# Patient Record
Sex: Female | Born: 1935
Health system: Southern US, Community
[De-identification: ages and names within clinical notes are randomized; demographics above are authoritative.]

## PROBLEM LIST (undated history)

## (undated) DIAGNOSIS — Z0000 Encounter for general adult medical examination without abnormal findings: Secondary | ICD-10-CM

## (undated) DIAGNOSIS — D72819 Decreased white blood cell count, unspecified: Secondary | ICD-10-CM

## (undated) DIAGNOSIS — E785 Hyperlipidemia, unspecified: Secondary | ICD-10-CM

## (undated) DIAGNOSIS — Z8619 Personal history of other infectious and parasitic diseases: Secondary | ICD-10-CM

## (undated) DIAGNOSIS — I480 Paroxysmal atrial fibrillation: Secondary | ICD-10-CM

## (undated) DIAGNOSIS — I10 Essential (primary) hypertension: Secondary | ICD-10-CM

## (undated) DIAGNOSIS — F419 Anxiety disorder, unspecified: Secondary | ICD-10-CM

## (undated) HISTORY — PX: ABDOMINAL HYSTERECTOMY: SHX81

## (undated) HISTORY — DX: Hyperlipidemia, unspecified: E78.5

## (undated) HISTORY — DX: Personal history of other infectious and parasitic diseases: Z86.19

## (undated) HISTORY — DX: Paroxysmal atrial fibrillation: I48.0

## (undated) HISTORY — DX: Essential (primary) hypertension: I10

## (undated) HISTORY — DX: Encounter for general adult medical examination without abnormal findings: Z00.00

## (undated) HISTORY — DX: Decreased white blood cell count, unspecified: D72.819

## (undated) HISTORY — DX: Anxiety disorder, unspecified: F41.9

---

## 1999-05-14 ENCOUNTER — Encounter: Admission: RE | Admit: 1999-05-14 | Discharge: 1999-05-14 | Payer: Self-pay | Admitting: Internal Medicine

## 1999-05-14 ENCOUNTER — Encounter: Payer: Self-pay | Admitting: Internal Medicine

## 1999-06-17 ENCOUNTER — Encounter: Admission: RE | Admit: 1999-06-17 | Discharge: 1999-06-17 | Payer: Self-pay | Admitting: Internal Medicine

## 1999-06-17 ENCOUNTER — Encounter: Payer: Self-pay | Admitting: Internal Medicine

## 2000-05-19 ENCOUNTER — Encounter: Admission: RE | Admit: 2000-05-19 | Discharge: 2000-05-19 | Payer: Self-pay | Admitting: Internal Medicine

## 2000-05-19 ENCOUNTER — Encounter: Payer: Self-pay | Admitting: Internal Medicine

## 2001-04-23 ENCOUNTER — Encounter: Payer: Self-pay | Admitting: Internal Medicine

## 2001-04-23 ENCOUNTER — Encounter: Admission: RE | Admit: 2001-04-23 | Discharge: 2001-04-23 | Payer: Self-pay | Admitting: Internal Medicine

## 2001-06-16 ENCOUNTER — Encounter: Payer: Self-pay | Admitting: Internal Medicine

## 2001-06-16 ENCOUNTER — Encounter: Admission: RE | Admit: 2001-06-16 | Discharge: 2001-06-16 | Payer: Self-pay | Admitting: Internal Medicine

## 2001-06-21 ENCOUNTER — Encounter: Admission: RE | Admit: 2001-06-21 | Discharge: 2001-06-21 | Payer: Self-pay | Admitting: Internal Medicine

## 2001-06-21 ENCOUNTER — Encounter: Payer: Self-pay | Admitting: Internal Medicine

## 2001-07-30 ENCOUNTER — Other Ambulatory Visit: Admission: RE | Admit: 2001-07-30 | Discharge: 2001-07-30 | Payer: Self-pay

## 2001-08-11 ENCOUNTER — Ambulatory Visit: Admission: RE | Admit: 2001-08-11 | Discharge: 2001-08-11 | Payer: Self-pay | Admitting: Gynecology

## 2001-09-01 ENCOUNTER — Encounter (INDEPENDENT_AMBULATORY_CARE_PROVIDER_SITE_OTHER): Payer: Self-pay | Admitting: Specialist

## 2001-09-01 ENCOUNTER — Inpatient Hospital Stay (HOSPITAL_COMMUNITY): Admission: RE | Admit: 2001-09-01 | Discharge: 2001-09-03 | Payer: Self-pay

## 2001-09-21 ENCOUNTER — Encounter: Admission: RE | Admit: 2001-09-21 | Discharge: 2001-09-21 | Payer: Self-pay

## 2002-05-10 ENCOUNTER — Encounter: Admission: RE | Admit: 2002-05-10 | Discharge: 2002-05-10 | Payer: Self-pay

## 2003-05-03 ENCOUNTER — Encounter: Admission: RE | Admit: 2003-05-03 | Discharge: 2003-05-03 | Payer: Self-pay | Admitting: Internal Medicine

## 2004-05-24 ENCOUNTER — Encounter: Admission: RE | Admit: 2004-05-24 | Discharge: 2004-05-24 | Payer: Self-pay | Admitting: Internal Medicine

## 2005-07-07 ENCOUNTER — Encounter: Admission: RE | Admit: 2005-07-07 | Discharge: 2005-07-07 | Payer: Self-pay | Admitting: Internal Medicine

## 2006-07-08 ENCOUNTER — Encounter: Admission: RE | Admit: 2006-07-08 | Discharge: 2006-07-08 | Payer: Self-pay | Admitting: Internal Medicine

## 2007-07-13 ENCOUNTER — Encounter: Admission: RE | Admit: 2007-07-13 | Discharge: 2007-07-13 | Payer: Self-pay | Admitting: Internal Medicine

## 2008-05-09 ENCOUNTER — Emergency Department (HOSPITAL_COMMUNITY): Admission: EM | Admit: 2008-05-09 | Discharge: 2008-05-09 | Payer: Self-pay | Admitting: Emergency Medicine

## 2008-05-15 ENCOUNTER — Encounter: Admission: RE | Admit: 2008-05-15 | Discharge: 2008-05-15 | Payer: Self-pay | Admitting: Internal Medicine

## 2008-08-21 ENCOUNTER — Encounter: Admission: RE | Admit: 2008-08-21 | Discharge: 2008-08-21 | Payer: Self-pay | Admitting: Internal Medicine

## 2009-01-19 IMAGING — US US ABDOMEN COMPLETE
1 series · 14 of 25 positions shown · non-contrast
Comparison: CT 05/09/2008

CLINICAL DATA: Right upper quadrant pain.  Abnormal CT scan.  Rule
out gallstones.

ABDOMEN ULTRASOUND
TECHNIQUE: Complete abdominal ultrasound examination was performed
including evaluation of the liver, gallbladder, bile ducts,
pancreas, kidneys, spleen, IVC, and abdominal aorta.

[Series 1: us abdomen complete · 0.18mm/px · 14 of 90 slices shown]
[im 1/90]
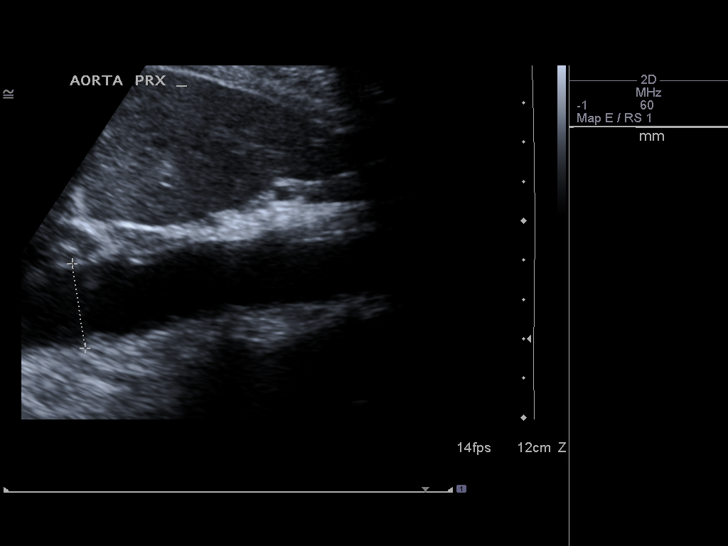
[im 8/90]
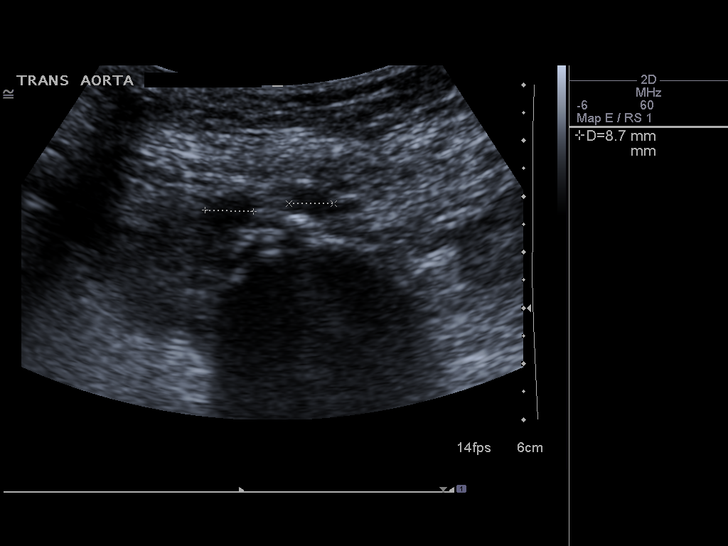
[im 15/90]
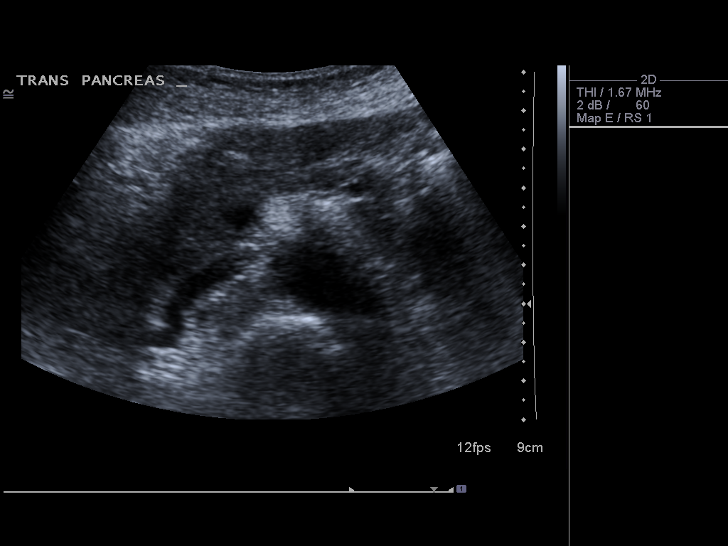
[im 23/90]
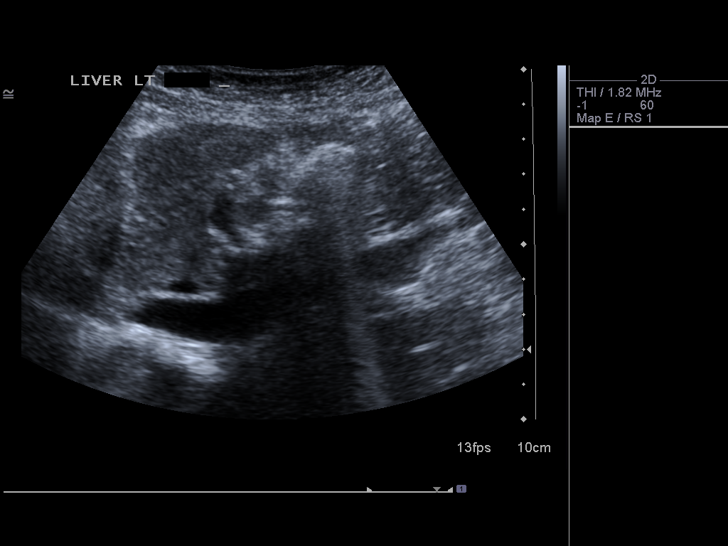
[im 30/90]
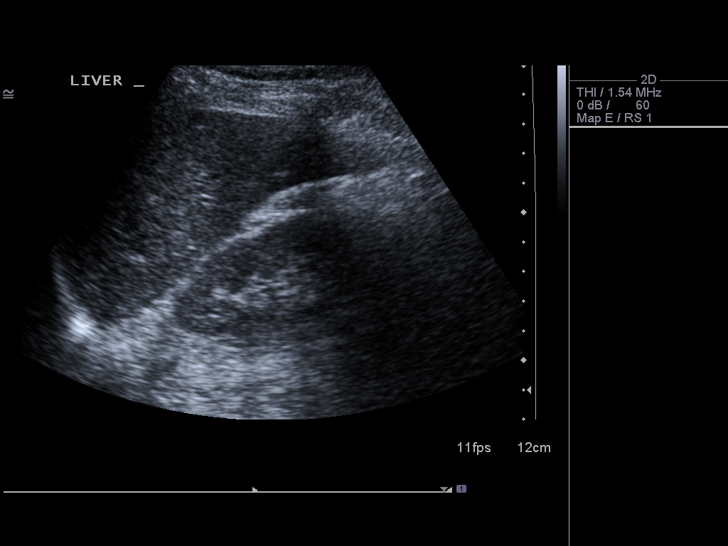
[im 34/90]
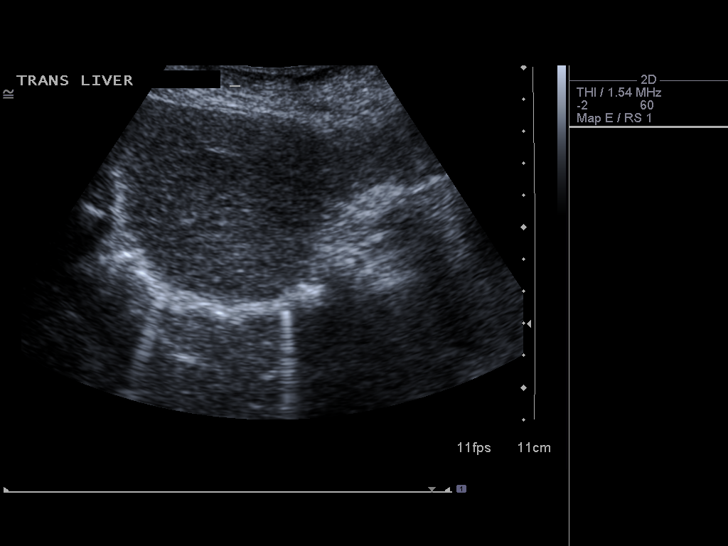
[im 41/90]
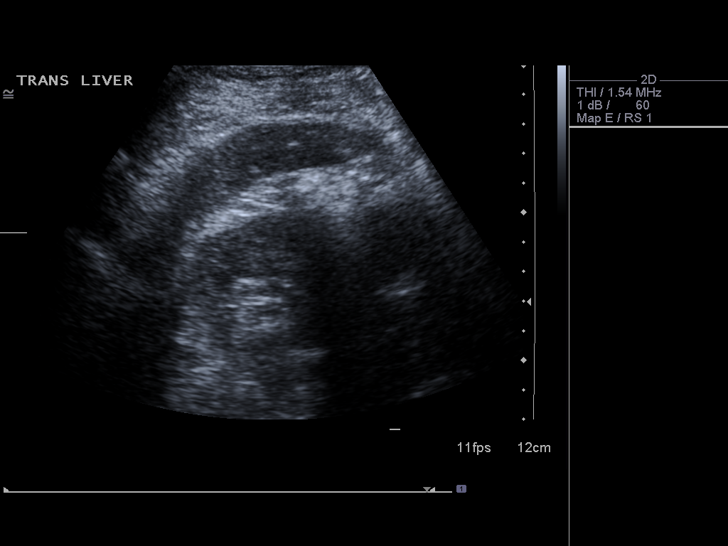
[im 49/90]
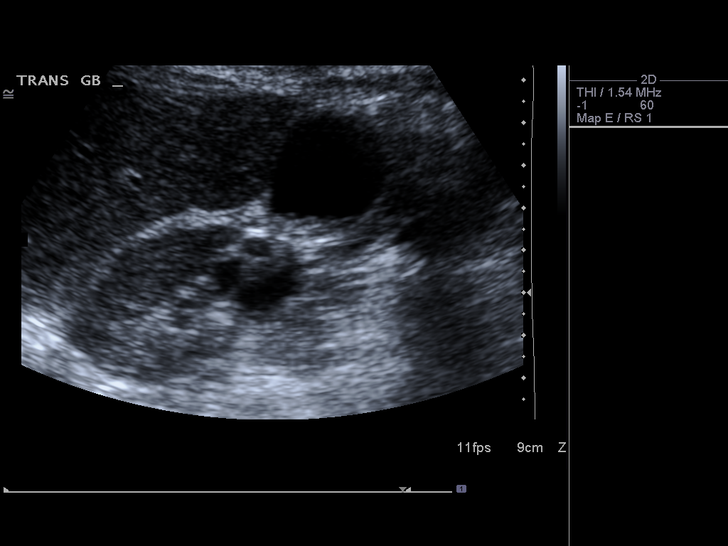
[im 56/90]
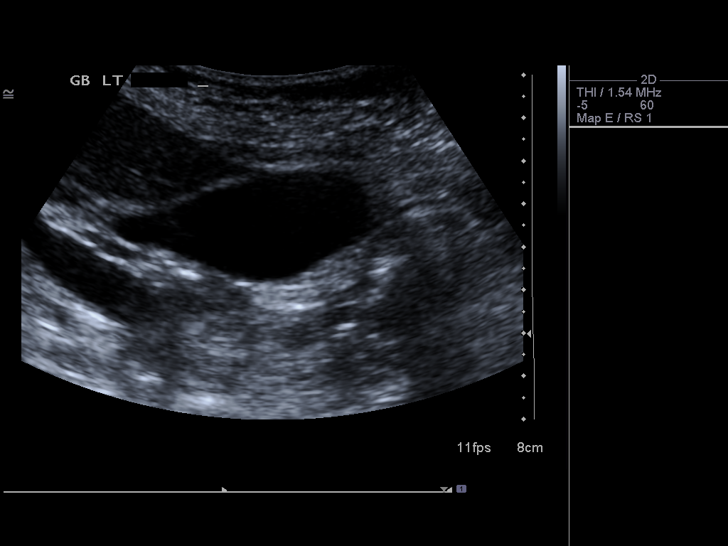
[im 60/90]
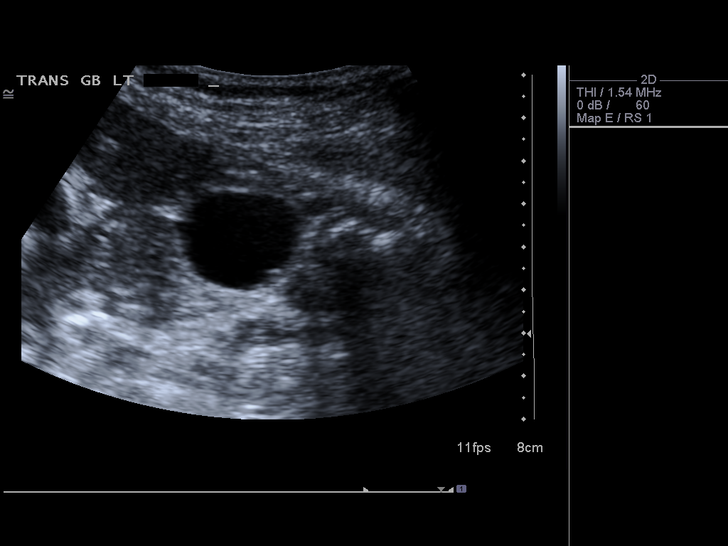
[im 67/90]
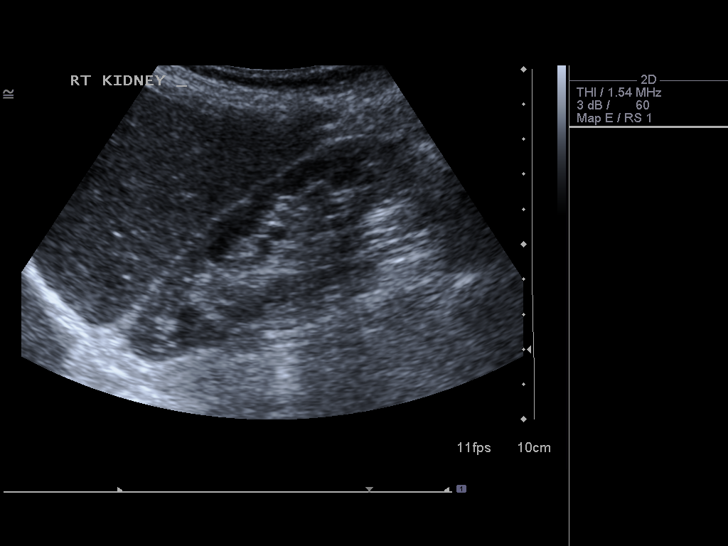
[im 75/90]
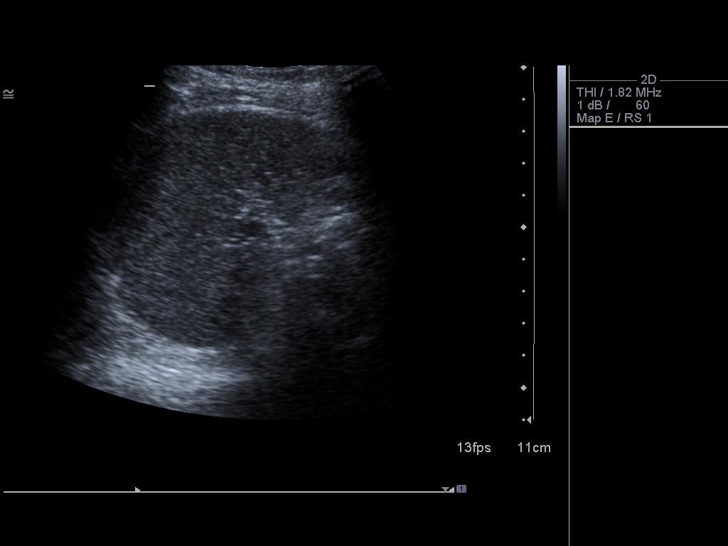
[im 82/90]
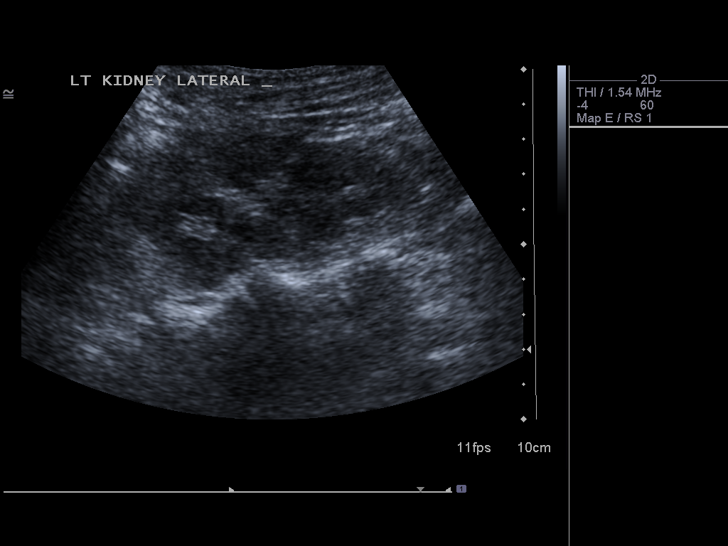
[im 90/90]
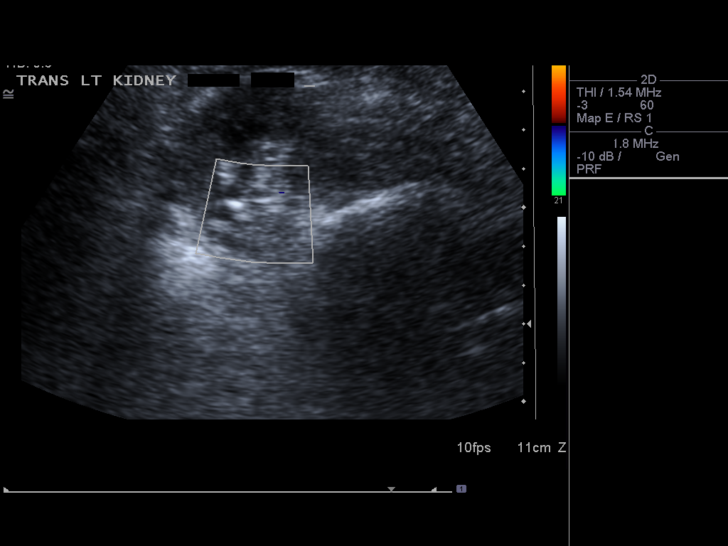

[14 of 25 positions shown; findings below may reference images not displayed]

FINDINGS: There is echogenic material layering dependently in the
gallbladder.  This does  not shadow and  may represent small stones
or some sludge in the gallbladder.  The gallbladder wall is not
thickened.  The common bile duct is 3.8 mm.  The liver, IVC,
pancreas, spleen are normal.  The kidneys are normal in size and
show no obstruction or mass.  There is a small calculus in the left
lower pole.  The abdominal aorta contains atherosclerotic
irregularity but no aneurysm.
IMPRESSION: Echogenic material is present in the gallbladder which does not
shadow and  may represent   sludge or small gallstones.  There is
no biliary obstruction.

Nonobstructing stone in the left lower pole.

## 2010-11-15 NOTE — Discharge Summary (Signed)
[facility]  Patient:    Peggy Peterson, Peggy Peterson Visit Number: [id number] MRN: [id number]          Service Type: SUR Location: [id number] Attending Physician:  Barbaraann Cao Dictated by:   Ronda Fairly. Galen Daft, M.D. Admit Date:  09/01/2001 Discharge Date: 09/03/2001   CC:         Theressa Millard, M.D.   Discharge Summary  ADMISSION DIAGNOSIS:  Adnexal mass, ovarian mass, uncertain etiology.  PRINCIPAL DIAGNOSIS:  Serous cystadenofibroma of the left ovary.  SECONDARY DIAGNOSIS:  Abdominal adhesions and appendix adhesions and ovarian adhesions.  PRINCIPAL PROCEDURE:  Bilateral salpingo-oophorectomy.  SECONDARY PROCEDURE: 1. Lysis of adhesions. 2. Diagnostic laparoscopy.  COMPLICATIONS OF HOSPITALIZATION:  None.  CONDITION AT DISCHARGE:  Improved.  HOSPITAL COURSE:  The patient was admitted on September 01, 2001 with the intention of a diagnostic laparoscopy, possible operative laparoscopy versus laparotomy. The patient was consented for bilateral salpingo-oophorectomy. The procedure was carried out without difficulty. Upon diagnostic laparoscopy, it was noted that there were marked adhesions of the peritoneum surface to the omental surfaces throughout the abdomen. The trocar site was clear of these adhesions. The ovary was somewhat adherent as well, both posteriorly and anteriorly and laterally. This was the ovarian mass. The left ovary could not be identified laparoscopically. Because of the marked adhesions present, the decision was made to switch from laparoscopic approach to open laparotomy approach. Using a Pfannenstiel incision, the laparotomy was carried out, bilateral salpingo-oophorectomy was carried out. The ovary had to be freed from its adhesive properties to the appendix where it was densely adherent and the appendix was left uninjured. The rectum also was adherent and this was dissected in the space which would typically be the rectovaginal space  but in this case was the recto-ovarian plane that had to be developed. The cyst remained intact and was sent off to pathology as an adnexal mass/ovary and tube intact. The left ovary was adherent in the posterior peritoneum. It was retroperitoneal and it required careful dissection of the ureter away from the field. After it was done, the left ovary and tube were removed. The patient had no active bleeding and the adhesions had been freed. Care was taken to inspect to make sure there was no evidence of any internal hernias. The abdomen was closed and the patient was discharged from the operating room.  In the postoperative course, postoperative day #1, she was doing quite well. I maintained her on patient-controlled analgesia and IV fluids for that day. On the second postoperative day, she was improving, tolerating liquids well, had urinated spontaneously without difficulty, and was showing signs of return of bowel function. The abdomen was soft, nondistended. The incision was clean, dry and intact and it was my desire to continue her on clear liquids because of the adhesions for a couple of more days prior to advancing her diet. She was therefore to be discharged home on clear liquids and followed in the office in approximately two weeks. The pathology came back as a benign serous cystadenofibroma and the left ovary was a benign ovary and tube. There was no evidence of any other signs of malignancy or premalignant condition.  DISPOSITION:   The patient was given discharge instructions, Percocet for analgesia, and full instructions regarding wound care, restrictions of movement and followup instructions and diet. She will begin regular diet as early as Monday, March 10 if she was doing well. The patient was discharged with no complications. Dictated by:  Ronda Fairly. Galen Daft, M.D. Attending Physician:  Barbaraann Cao DD:  09/03/01 TD:  09/06/01 Job: [id number]

## 2010-11-15 NOTE — Consult Note (Signed)
[facility]  Patient:    Peggy Peterson, Peggy Peterson Visit Number: [id number] MRN: [id number]          Service Type: Attending:  Rande Brunt. Clarke-Pearson, M.D. Dictated by:   Rande Brunt. Clarke-Pearson, M.D. Proc. Date: 08/11/01   CC:         Ronda Fairly. Galen Daft, M.D.  Theressa Millard, M.D.  Telford Nab, R.N.   Consultation Report  A 77 year old white female seen in consultation at the request of Dr. Gwendalyn Ege for evaluation of a complex mass.  The patient while undergoing work-up for upper abdominal discomfort which was thought to be possibly cholecystitis had a CT scan of the abdomen and pelvis which discovered an asymptomatic mass in the pelvis measuring 8.2 x 9.3 cm.  This is a complex cystic mass.  It apparently arises from the right ovary because the left ovary was visualized and was normal.  There was no free fluid in the pelvis and no evidence of tumor implants in the peritoneal cavity or omentum.  CA-125 value is 10. Ultrasound has also been performed which confirms CT findings.  There is no solid component or nodularities in the cyst.  PAST GYNECOLOGIC HISTORY:  Abdominal hysterectomy for abnormal bleeding two decades ago.  PAST MEDICAL HISTORY:  Diverticulitis occasionally treated with antibiotics when she develops left-sided pain.  CURRENT MEDICATIONS: 1. Xanax 0.25 mg q.12h. 2. Estrace 1.5 mg daily. 3. Aspirin.  PAST SURGICAL HISTORY:  TAH, tonsils and adenoidectomy, breast cyst aspiration.  ALLERGIES:  None.  PAST OBSTETRICAL HISTORY:  Gravida 3.  SOCIAL HISTORY:  The patient does not smoke.  She works as a Scientist, physiological in a Psychiatric nurse business.  REVIEW OF SYSTEMS:  Negative except as noted above.  PHYSICAL EXAMINATION  VITAL SIGNS:  Height 4 feet 11 inches, weight 122 pounds, blood pressure 126/82, pulse 70, respiratory rate 18.  GENERAL:  The patient is a healthy white female in no acute distress.  HEENT:  Negative.  NECK:   Supple without thyromegaly.  NODES:  There is no supraclavicular or inguinal adenopathy.  ABDOMEN:  Soft, nontender.  No masses, organomegaly, ascites, or hernias are noted.  She has a well healed Pfannenstiel incision.  PELVIC:  EGBUS:  Normal.  Vagina is clean, well supported.  Bimanual and rectovaginal examinations reveal a mass at the apex of the vagina measuring approximately 8 cm in diameter.  This is not tender and mobile.  EXTREMITIES:  Lower extremities without edema or varicosities.  IMPRESSION:  Complex pelvic mass without any solid components or nodularity and a normal CA-125.  I believe this is most likely benign ovarian neoplasm.  I have discussed the findings and my thoughts with the patient.  Given that I believe this is a benign neoplasm, would suggest Dr. Galen Daft proceed with surgery at his convenience.  On the very small chance that this is an ovarian cancer, it would be wise to schedule the surgery on a day when I am in [place] and have me posted as a standby consult. Dictated by:   Rande Brunt. Clarke-Pearson, M.D. Attending:  Rande Brunt. Clarke-Pearson, M.D. DD:  08/11/01 TD:  08/11/01 Job: [id number]

## 2010-11-15 NOTE — Op Note (Signed)
[facility]  Patient:    Peggy, Peterson Visit Number: [id number] MRN: [id number]          Service Type: MED Location: [phone] Attending Physician:  Barbaraann Cao Dictated by:   Ronda Fairly. Galen Daft, M.D. Proc. Date: 09/01/01 Admit Date:  09/01/2001   CC:         Fayrene Fearing C. Earl Gala, M.D.  Trellis Moment, M.D.   Operative Report  PREOPERATIVE DIAGNOSIS:  Pelvic mass.  POSTOPERATIVE DIAGNOSES: 1. Pelvic mass. 2. Severe pelvic intestinal adhesions. 4. Right serous cystadenoma. 5. Normal left ovary retroperitoneal.  PROCEDURE: 1. Diagnostic laparoscopy. 2. Exploratory laparotomy with lysis of adhesions. 3. Right salpingo-oophorectomy followed by left salpingo-oophorectomy.  SURGEON:  Ronda Fairly. Galen Daft, M.D.  ASSISTANT:  Trellis Moment, M.D.  COMPLICATIONS:  None.  ESTIMATED BLOOD LOSS:  200 cc.  ANESTHESIA:  General.  SPECIMEN:  Right tube and ovary and left tube and ovary.  OPERATIVE NOTE:  The patient was identified as Peggy Peterson.  We obtained informed consent prior to the procedure.  She wished to have bilateral salpingo-oophorectomy, and the discussion of laparoscopy approach versus laparotomy approach had been reviewed with the patient preoperatively.  After induction of anesthesia, the patient had a Betadine prep and Foley indwelling catheter.  The initial incision was an umbilical incision which allowed for the Veress needle insertion.  Its insertion was deemed to be correct by placement and by pressures.  The abdomen was inflated with carbon dioxide gas. Next, the trocar was placed in the umbilical site.  This revealed the evidence of severe adhesions of the omentum.  A secondary site was created to reevaluate the adhesions to see if these were laparoscopically resectable. These were felt to be not laparoscopically resectable based on their density from the secondary port as well, and the second port was placed under  direct visualization in a clear zone.  The ovary itself also was somewhat adherent to these adhesions and large and cystic.  The side it was arising from, from laparoscopy was not readily identified secondary to adhesions.  Therefore, it was felt prudent to convert to laparotomy and patient had provided informed consent prior to the procedure as well.  Laparotomy was carried out through a Pfannenstiel incision.  The fascia was transected transversely and the abdomen entered at a point clear of the adhesions.  The omental adhesions were dense, requiring several Kelly clamps and ligatures of the upper abdomen.  These omental adhesions were freed with some time, and the other adhesions that were present was the appendix which was densely adherent to the ovarian mass.  This was gracefully lysed with Metzenbaum scissor dissection away from their, and it was hemostatic.  The appendix was inspected at this point in the case and intact, and it was inspected also at the end of the case and found to be intact.  The rectum also similarly on the posterior end was intimately involved with the posterior wall of this mass.  Therefore, the plane had to be developed using Metzenbaum scissor dissection along this region as well. Prior to resection of the mass, the washings were carried out in case the mass was malignant by frozen section.  The infundibulopelvic ligament was identified, and it was separate and distinct from the ureter which also was identified.  The ureter itself required ureterolysis in an effort to free it from the mass area.  The ureter was separate and distinct at the time of surgical excision of the mass.  After clamping of the infundibulopelvic pelvic ligament, it was divided and ligated with 0 Vicryl tie followed by 0 Vicryl suture.  The remaining adhesions were taken off the adnexal mass, and it was freed.  It was sent to pathology.  Diagnosis was benign cyst and serous cystadenoma.   The ovary and its cystic components were sent off intact without rupture.  The left ovary was not visible because it was retroperitoneal.  It required dissection into the retroperitoneal space as was the tube somewhat buried as well retroperitoneally and, also, identification of the ureter was carried out carefully to be separate and distinct from the vascular supply for this ovary, the left ovary being identified and tube separate and distinct from other structures, infundibulopelvic ligament separate and distinct from other structures.  This was clamped, ligated, and divided.  The ligature was 0 Vicryl followed by 0 Vicryl tie.  The left ovary and tube were removed, and hemostasis was noted.  The ureters on both sides were noted to have free peristalsis and no dilatation.  The tissues in the lower abdomen were hemostatic, but there was raw surface at the level of the lysis of adhesions. This was prophylatically treated with Gelfoam in this area as well as the raw surface overlying the rectum.  The rectum was intact and hemostatic.  The areas were inspected on the other areas, and they were all hemostatic as well. The instrument, sponge, and needle counts were correct after removal of the retaining retractor and sponges.  The tissues all underneath the fascia were hemostatic and fascia was closed with #1 Vicryl from either angle.  All subcutaneous tissues were hemostatic.  Prior to closure of the fascia, the 10 mm trocar port area entry into the fascia was closed with a single interrupted figure-of-eight suture.  The subcutaneous tissues were reapproximated using interrupted Vicryl, and the skin was closed with 4-0 Monocryl in the longitudinal incision running subcuticular followed by two interrupted sutures in both the umbilical and secondary trocar site areas. All instrument, sponge, and needle counts were correct.  The patient left the operating room in stable condition without any  complications.  Dictated by:   Ronda Fairly. Galen Daft, M.D. Attending Physician:  Barbaraann Cao DD:  09/01/01 TD:  09/02/01 Job: [id number]

## 2011-04-01 LAB — DIFFERENTIAL
Basophils Absolute: 0
Basophils Relative: 0
Lymphs Abs: 1
Monocytes Absolute: 0.3
Neutro Abs: 2.7
Neutrophils Relative %: 67

## 2011-04-01 LAB — URINALYSIS, ROUTINE W REFLEX MICROSCOPIC
Bilirubin Urine: NEGATIVE
Hgb urine dipstick: NEGATIVE

## 2011-04-01 LAB — COMPREHENSIVE METABOLIC PANEL
AST: 20
Alkaline Phosphatase: 63
Calcium: 9.6
Chloride: 107
GFR calc non Af Amer: >60
Glucose, Bld: 110 — ABNORMAL HIGH
Potassium: 4.1

## 2011-04-01 LAB — CBC
Hemoglobin: 13.8
MCV: 89.5
Platelets: 149 — ABNORMAL LOW
RBC: 4.58
RDW: 13.6

## 2011-07-15 DIAGNOSIS — H6120 Impacted cerumen, unspecified ear: Secondary | ICD-10-CM | POA: Diagnosis not present

## 2011-10-08 DIAGNOSIS — Z1331 Encounter for screening for depression: Secondary | ICD-10-CM | POA: Diagnosis not present

## 2011-10-08 DIAGNOSIS — C4491 Basal cell carcinoma of skin, unspecified: Secondary | ICD-10-CM | POA: Diagnosis not present

## 2011-10-08 DIAGNOSIS — I4891 Unspecified atrial fibrillation: Secondary | ICD-10-CM | POA: Diagnosis not present

## 2011-10-08 DIAGNOSIS — I10 Essential (primary) hypertension: Secondary | ICD-10-CM | POA: Diagnosis not present

## 2011-10-22 DIAGNOSIS — K469 Unspecified abdominal hernia without obstruction or gangrene: Secondary | ICD-10-CM | POA: Diagnosis not present

## 2011-11-04 DIAGNOSIS — C44611 Basal cell carcinoma of skin of unspecified upper limb, including shoulder: Secondary | ICD-10-CM | POA: Diagnosis not present

## 2011-11-04 DIAGNOSIS — L821 Other seborrheic keratosis: Secondary | ICD-10-CM | POA: Diagnosis not present

## 2011-11-04 DIAGNOSIS — Z85828 Personal history of other malignant neoplasm of skin: Secondary | ICD-10-CM | POA: Diagnosis not present

## 2011-12-01 DIAGNOSIS — H259 Unspecified age-related cataract: Secondary | ICD-10-CM | POA: Diagnosis not present

## 2011-12-01 DIAGNOSIS — H524 Presbyopia: Secondary | ICD-10-CM | POA: Diagnosis not present

## 2011-12-01 DIAGNOSIS — H52229 Regular astigmatism, unspecified eye: Secondary | ICD-10-CM | POA: Diagnosis not present

## 2012-02-16 DIAGNOSIS — R636 Underweight: Secondary | ICD-10-CM | POA: Diagnosis not present

## 2012-02-16 DIAGNOSIS — I10 Essential (primary) hypertension: Secondary | ICD-10-CM | POA: Diagnosis not present

## 2012-04-22 DIAGNOSIS — T148XXA Other injury of unspecified body region, initial encounter: Secondary | ICD-10-CM | POA: Diagnosis not present

## 2012-05-14 DIAGNOSIS — H6120 Impacted cerumen, unspecified ear: Secondary | ICD-10-CM | POA: Diagnosis not present

## 2012-06-07 DIAGNOSIS — I10 Essential (primary) hypertension: Secondary | ICD-10-CM | POA: Diagnosis not present

## 2012-06-25 DIAGNOSIS — M545 Low back pain, unspecified: Secondary | ICD-10-CM | POA: Diagnosis not present

## 2012-06-29 DIAGNOSIS — M999 Biomechanical lesion, unspecified: Secondary | ICD-10-CM | POA: Diagnosis not present

## 2012-06-29 DIAGNOSIS — G544 Lumbosacral root disorders, not elsewhere classified: Secondary | ICD-10-CM | POA: Diagnosis not present

## 2012-07-02 DIAGNOSIS — G544 Lumbosacral root disorders, not elsewhere classified: Secondary | ICD-10-CM | POA: Diagnosis not present

## 2012-07-02 DIAGNOSIS — M999 Biomechanical lesion, unspecified: Secondary | ICD-10-CM | POA: Diagnosis not present

## 2012-07-05 DIAGNOSIS — M999 Biomechanical lesion, unspecified: Secondary | ICD-10-CM | POA: Diagnosis not present

## 2012-07-05 DIAGNOSIS — G544 Lumbosacral root disorders, not elsewhere classified: Secondary | ICD-10-CM | POA: Diagnosis not present

## 2012-07-07 DIAGNOSIS — G544 Lumbosacral root disorders, not elsewhere classified: Secondary | ICD-10-CM | POA: Diagnosis not present

## 2012-07-07 DIAGNOSIS — M999 Biomechanical lesion, unspecified: Secondary | ICD-10-CM | POA: Diagnosis not present

## 2012-07-08 DIAGNOSIS — G544 Lumbosacral root disorders, not elsewhere classified: Secondary | ICD-10-CM | POA: Diagnosis not present

## 2012-07-08 DIAGNOSIS — M999 Biomechanical lesion, unspecified: Secondary | ICD-10-CM | POA: Diagnosis not present

## 2012-07-12 DIAGNOSIS — G544 Lumbosacral root disorders, not elsewhere classified: Secondary | ICD-10-CM | POA: Diagnosis not present

## 2012-07-12 DIAGNOSIS — M999 Biomechanical lesion, unspecified: Secondary | ICD-10-CM | POA: Diagnosis not present

## 2012-07-13 DIAGNOSIS — M999 Biomechanical lesion, unspecified: Secondary | ICD-10-CM | POA: Diagnosis not present

## 2012-07-13 DIAGNOSIS — G544 Lumbosacral root disorders, not elsewhere classified: Secondary | ICD-10-CM | POA: Diagnosis not present

## 2012-07-15 DIAGNOSIS — M999 Biomechanical lesion, unspecified: Secondary | ICD-10-CM | POA: Diagnosis not present

## 2012-07-15 DIAGNOSIS — G544 Lumbosacral root disorders, not elsewhere classified: Secondary | ICD-10-CM | POA: Diagnosis not present

## 2012-07-20 DIAGNOSIS — M999 Biomechanical lesion, unspecified: Secondary | ICD-10-CM | POA: Diagnosis not present

## 2012-07-20 DIAGNOSIS — G544 Lumbosacral root disorders, not elsewhere classified: Secondary | ICD-10-CM | POA: Diagnosis not present

## 2012-07-21 DIAGNOSIS — M545 Low back pain, unspecified: Secondary | ICD-10-CM | POA: Diagnosis not present

## 2012-07-22 DIAGNOSIS — M999 Biomechanical lesion, unspecified: Secondary | ICD-10-CM | POA: Diagnosis not present

## 2012-07-22 DIAGNOSIS — G544 Lumbosacral root disorders, not elsewhere classified: Secondary | ICD-10-CM | POA: Diagnosis not present

## 2012-07-23 DIAGNOSIS — M999 Biomechanical lesion, unspecified: Secondary | ICD-10-CM | POA: Diagnosis not present

## 2012-07-23 DIAGNOSIS — G544 Lumbosacral root disorders, not elsewhere classified: Secondary | ICD-10-CM | POA: Diagnosis not present

## 2012-07-26 DIAGNOSIS — G544 Lumbosacral root disorders, not elsewhere classified: Secondary | ICD-10-CM | POA: Diagnosis not present

## 2012-07-26 DIAGNOSIS — M999 Biomechanical lesion, unspecified: Secondary | ICD-10-CM | POA: Diagnosis not present

## 2012-08-02 DIAGNOSIS — M999 Biomechanical lesion, unspecified: Secondary | ICD-10-CM | POA: Diagnosis not present

## 2012-08-02 DIAGNOSIS — G544 Lumbosacral root disorders, not elsewhere classified: Secondary | ICD-10-CM | POA: Diagnosis not present

## 2012-08-03 DIAGNOSIS — G544 Lumbosacral root disorders, not elsewhere classified: Secondary | ICD-10-CM | POA: Diagnosis not present

## 2012-08-03 DIAGNOSIS — M999 Biomechanical lesion, unspecified: Secondary | ICD-10-CM | POA: Diagnosis not present

## 2012-08-09 DIAGNOSIS — G544 Lumbosacral root disorders, not elsewhere classified: Secondary | ICD-10-CM | POA: Diagnosis not present

## 2012-08-09 DIAGNOSIS — M999 Biomechanical lesion, unspecified: Secondary | ICD-10-CM | POA: Diagnosis not present

## 2012-08-10 DIAGNOSIS — M999 Biomechanical lesion, unspecified: Secondary | ICD-10-CM | POA: Diagnosis not present

## 2012-08-10 DIAGNOSIS — G544 Lumbosacral root disorders, not elsewhere classified: Secondary | ICD-10-CM | POA: Diagnosis not present

## 2012-08-17 DIAGNOSIS — G544 Lumbosacral root disorders, not elsewhere classified: Secondary | ICD-10-CM | POA: Diagnosis not present

## 2012-08-17 DIAGNOSIS — M999 Biomechanical lesion, unspecified: Secondary | ICD-10-CM | POA: Diagnosis not present

## 2012-08-18 DIAGNOSIS — M999 Biomechanical lesion, unspecified: Secondary | ICD-10-CM | POA: Diagnosis not present

## 2012-08-18 DIAGNOSIS — G544 Lumbosacral root disorders, not elsewhere classified: Secondary | ICD-10-CM | POA: Diagnosis not present

## 2012-08-23 DIAGNOSIS — M999 Biomechanical lesion, unspecified: Secondary | ICD-10-CM | POA: Diagnosis not present

## 2012-08-23 DIAGNOSIS — G544 Lumbosacral root disorders, not elsewhere classified: Secondary | ICD-10-CM | POA: Diagnosis not present

## 2012-08-24 DIAGNOSIS — G544 Lumbosacral root disorders, not elsewhere classified: Secondary | ICD-10-CM | POA: Diagnosis not present

## 2012-08-24 DIAGNOSIS — M999 Biomechanical lesion, unspecified: Secondary | ICD-10-CM | POA: Diagnosis not present

## 2012-08-30 DIAGNOSIS — G544 Lumbosacral root disorders, not elsewhere classified: Secondary | ICD-10-CM | POA: Diagnosis not present

## 2012-08-30 DIAGNOSIS — M999 Biomechanical lesion, unspecified: Secondary | ICD-10-CM | POA: Diagnosis not present

## 2012-09-01 DIAGNOSIS — M999 Biomechanical lesion, unspecified: Secondary | ICD-10-CM | POA: Diagnosis not present

## 2012-09-01 DIAGNOSIS — G544 Lumbosacral root disorders, not elsewhere classified: Secondary | ICD-10-CM | POA: Diagnosis not present

## 2012-09-10 DIAGNOSIS — H259 Unspecified age-related cataract: Secondary | ICD-10-CM | POA: Diagnosis not present

## 2012-10-06 DIAGNOSIS — I10 Essential (primary) hypertension: Secondary | ICD-10-CM | POA: Diagnosis not present

## 2013-02-14 DIAGNOSIS — I10 Essential (primary) hypertension: Secondary | ICD-10-CM | POA: Diagnosis not present

## 2013-06-28 DIAGNOSIS — I10 Essential (primary) hypertension: Secondary | ICD-10-CM | POA: Diagnosis not present

## 2013-10-26 DIAGNOSIS — I10 Essential (primary) hypertension: Secondary | ICD-10-CM | POA: Diagnosis not present

## 2013-10-26 DIAGNOSIS — F411 Generalized anxiety disorder: Secondary | ICD-10-CM | POA: Diagnosis not present

## 2014-03-15 DIAGNOSIS — F411 Generalized anxiety disorder: Secondary | ICD-10-CM | POA: Diagnosis not present

## 2014-03-15 DIAGNOSIS — I10 Essential (primary) hypertension: Secondary | ICD-10-CM | POA: Diagnosis not present

## 2014-04-10 DIAGNOSIS — E785 Hyperlipidemia, unspecified: Secondary | ICD-10-CM | POA: Diagnosis not present

## 2014-04-10 DIAGNOSIS — I10 Essential (primary) hypertension: Secondary | ICD-10-CM | POA: Diagnosis not present

## 2014-04-10 DIAGNOSIS — R739 Hyperglycemia, unspecified: Secondary | ICD-10-CM | POA: Diagnosis not present

## 2014-04-10 DIAGNOSIS — F419 Anxiety disorder, unspecified: Secondary | ICD-10-CM | POA: Diagnosis not present

## 2014-04-10 DIAGNOSIS — R109 Unspecified abdominal pain: Secondary | ICD-10-CM | POA: Diagnosis not present

## 2014-04-10 DIAGNOSIS — K5900 Constipation, unspecified: Secondary | ICD-10-CM | POA: Diagnosis not present

## 2014-05-09 DIAGNOSIS — R739 Hyperglycemia, unspecified: Secondary | ICD-10-CM | POA: Diagnosis not present

## 2014-05-09 DIAGNOSIS — E785 Hyperlipidemia, unspecified: Secondary | ICD-10-CM | POA: Diagnosis not present

## 2014-05-09 DIAGNOSIS — R109 Unspecified abdominal pain: Secondary | ICD-10-CM | POA: Diagnosis not present

## 2014-05-09 DIAGNOSIS — I10 Essential (primary) hypertension: Secondary | ICD-10-CM | POA: Diagnosis not present

## 2014-05-09 DIAGNOSIS — R7309 Other abnormal glucose: Secondary | ICD-10-CM | POA: Diagnosis not present

## 2014-05-09 DIAGNOSIS — F419 Anxiety disorder, unspecified: Secondary | ICD-10-CM | POA: Diagnosis not present

## 2014-05-09 DIAGNOSIS — K5900 Constipation, unspecified: Secondary | ICD-10-CM | POA: Diagnosis not present

## 2014-06-11 DIAGNOSIS — Z23 Encounter for immunization: Secondary | ICD-10-CM | POA: Diagnosis not present

## 2014-08-21 DIAGNOSIS — E785 Hyperlipidemia, unspecified: Secondary | ICD-10-CM | POA: Diagnosis not present

## 2014-08-21 DIAGNOSIS — F419 Anxiety disorder, unspecified: Secondary | ICD-10-CM | POA: Diagnosis not present

## 2014-08-21 DIAGNOSIS — K5900 Constipation, unspecified: Secondary | ICD-10-CM | POA: Diagnosis not present

## 2014-08-21 DIAGNOSIS — R7309 Other abnormal glucose: Secondary | ICD-10-CM | POA: Diagnosis not present

## 2014-08-21 DIAGNOSIS — I10 Essential (primary) hypertension: Secondary | ICD-10-CM | POA: Diagnosis not present

## 2014-09-15 DIAGNOSIS — K5900 Constipation, unspecified: Secondary | ICD-10-CM | POA: Diagnosis not present

## 2014-09-15 DIAGNOSIS — F419 Anxiety disorder, unspecified: Secondary | ICD-10-CM | POA: Diagnosis not present

## 2014-09-15 DIAGNOSIS — I10 Essential (primary) hypertension: Secondary | ICD-10-CM | POA: Diagnosis not present

## 2014-09-15 DIAGNOSIS — R7309 Other abnormal glucose: Secondary | ICD-10-CM | POA: Diagnosis not present

## 2014-09-15 DIAGNOSIS — E785 Hyperlipidemia, unspecified: Secondary | ICD-10-CM | POA: Diagnosis not present

## 2014-09-15 DIAGNOSIS — N951 Menopausal and female climacteric states: Secondary | ICD-10-CM | POA: Diagnosis not present

## 2014-10-16 DIAGNOSIS — R232 Flushing: Secondary | ICD-10-CM | POA: Diagnosis not present

## 2014-10-16 DIAGNOSIS — R194 Change in bowel habit: Secondary | ICD-10-CM | POA: Diagnosis not present

## 2014-10-17 DIAGNOSIS — I739 Peripheral vascular disease, unspecified: Secondary | ICD-10-CM | POA: Diagnosis not present

## 2014-11-20 DIAGNOSIS — K5900 Constipation, unspecified: Secondary | ICD-10-CM | POA: Diagnosis not present

## 2014-11-20 DIAGNOSIS — E785 Hyperlipidemia, unspecified: Secondary | ICD-10-CM | POA: Diagnosis not present

## 2014-11-20 DIAGNOSIS — R7309 Other abnormal glucose: Secondary | ICD-10-CM | POA: Diagnosis not present

## 2014-11-20 DIAGNOSIS — N951 Menopausal and female climacteric states: Secondary | ICD-10-CM | POA: Diagnosis not present

## 2014-11-20 DIAGNOSIS — F419 Anxiety disorder, unspecified: Secondary | ICD-10-CM | POA: Diagnosis not present

## 2014-11-20 DIAGNOSIS — I10 Essential (primary) hypertension: Secondary | ICD-10-CM | POA: Diagnosis not present

## 2015-02-05 DIAGNOSIS — R7309 Other abnormal glucose: Secondary | ICD-10-CM | POA: Diagnosis not present

## 2015-02-05 DIAGNOSIS — Z0000 Encounter for general adult medical examination without abnormal findings: Secondary | ICD-10-CM | POA: Diagnosis not present

## 2015-02-05 DIAGNOSIS — N951 Menopausal and female climacteric states: Secondary | ICD-10-CM | POA: Diagnosis not present

## 2015-02-05 DIAGNOSIS — Z1389 Encounter for screening for other disorder: Secondary | ICD-10-CM | POA: Diagnosis not present

## 2015-02-05 DIAGNOSIS — Z01118 Encounter for examination of ears and hearing with other abnormal findings: Secondary | ICD-10-CM | POA: Diagnosis not present

## 2015-02-05 DIAGNOSIS — K5900 Constipation, unspecified: Secondary | ICD-10-CM | POA: Diagnosis not present

## 2015-02-05 DIAGNOSIS — E785 Hyperlipidemia, unspecified: Secondary | ICD-10-CM | POA: Diagnosis not present

## 2015-02-05 DIAGNOSIS — F419 Anxiety disorder, unspecified: Secondary | ICD-10-CM | POA: Diagnosis not present

## 2015-02-05 DIAGNOSIS — I10 Essential (primary) hypertension: Secondary | ICD-10-CM | POA: Diagnosis not present

## 2015-03-26 ENCOUNTER — Encounter: Payer: Self-pay | Admitting: *Deleted

## 2015-03-26 ENCOUNTER — Telehealth: Payer: Self-pay | Admitting: *Deleted

## 2015-03-26 NOTE — Telephone Encounter (Signed)
Pre-Visit Call completed with patient and chart updated.   Pre-Visit Info documented in Specialty Comments under [employer].    

## 2015-03-27 ENCOUNTER — Ambulatory Visit: Payer: Self-pay | Admitting: Family Medicine

## 2015-04-04 ENCOUNTER — Telehealth: Payer: Self-pay | Admitting: Behavioral Health

## 2015-04-04 NOTE — Telephone Encounter (Signed)
Pre-Visit Info has already been completed with other RN staff. Appointment  confirmed for tomorrow, 10/6 at 2:00 PM.

## 2015-04-05 ENCOUNTER — Ambulatory Visit: Payer: Self-pay | Admitting: Family Medicine

## 2015-04-05 ENCOUNTER — Encounter: Payer: Self-pay | Admitting: Family Medicine

## 2015-04-05 ENCOUNTER — Ambulatory Visit (INDEPENDENT_AMBULATORY_CARE_PROVIDER_SITE_OTHER): Payer: Medicare Other | Admitting: Family Medicine

## 2015-04-05 VITALS — BP 138/74 | HR 65 | Temp 98.2°F | Ht 59.5 in | Wt 96.2 lb

## 2015-04-05 DIAGNOSIS — I480 Paroxysmal atrial fibrillation: Secondary | ICD-10-CM

## 2015-04-05 DIAGNOSIS — E782 Mixed hyperlipidemia: Secondary | ICD-10-CM

## 2015-04-05 DIAGNOSIS — Z8619 Personal history of other infectious and parasitic diseases: Secondary | ICD-10-CM | POA: Insufficient documentation

## 2015-04-05 DIAGNOSIS — Z23 Encounter for immunization: Secondary | ICD-10-CM

## 2015-04-05 DIAGNOSIS — D72819 Decreased white blood cell count, unspecified: Secondary | ICD-10-CM

## 2015-04-05 DIAGNOSIS — E785 Hyperlipidemia, unspecified: Secondary | ICD-10-CM

## 2015-04-05 DIAGNOSIS — F419 Anxiety disorder, unspecified: Secondary | ICD-10-CM | POA: Diagnosis not present

## 2015-04-05 DIAGNOSIS — I10 Essential (primary) hypertension: Secondary | ICD-10-CM | POA: Diagnosis not present

## 2015-04-05 HISTORY — DX: Paroxysmal atrial fibrillation: I48.0

## 2015-04-05 MED ORDER — ATENOLOL 50 MG PO TABS: 50.0000 mg | ORAL_TABLET | Freq: Every day | ORAL | Status: DC

## 2015-04-05 MED ORDER — SERTRALINE HCL 25 MG PO TABS: 25.0000 mg | ORAL_TABLET | Freq: Every day | ORAL | Status: DC

## 2015-04-05 MED ORDER — LISINOPRIL 10 MG PO TABS: 10.0000 mg | ORAL_TABLET | Freq: Every day | ORAL | Status: DC

## 2015-04-05 NOTE — Progress Notes (Signed)
Pre visit review using our clinic review tool, if applicable. No additional management support is needed unless otherwise documented below in the visit note. 

## 2015-04-05 NOTE — Progress Notes (Signed)
Subjective:    Patient ID: Peggy Peterson, female    DOB: January 31, 1927, 81 y.o.   MRN: [id number]  Chief Complaint  Patient presents with  . Establish Care    HPI Patient is in today for new patient appointment. She has a past medical history that includes hypertension, hyperlipidemia, anxiety and she is here today to establish care. No recent illness. She reports her previous doctor starting sertraline for anxiety and depression but she does not feel that helping and she denies any history of depression. Denies CP/palp/SOB/HA/congestion/fevers/GI or GU c/o. Taking meds as prescribed  Past Medical History  Diagnosis Date  . Hypertension   . Anxiety     Past Surgical History  Procedure Laterality Date  . Abdominal hysterectomy      Family History  Problem Relation Age of Onset  . Cancer Mother 26    leukemia     Social History   Social History  . Marital Status: Married    Spouse Name: N/A  . Number of Children: N/A  . Years of Education: N/A   Occupational History  . retired     Social History Main Topics  . Smoking status: Former Research scientist (life sciences)  . Smokeless tobacco: Not on file     Comment: Stopper smoking in 1960  . Alcohol Use: No  . Drug Use: No  . Sexual Activity: Not on file   Other Topics Concern  . Not on file   Social History Narrative    Outpatient Prescriptions Prior to Visit  Medication Sig Dispense Refill  . ALPRAZolam (XANAX) 1 MG tablet Take 1 mg by mouth at bedtime as needed for anxiety. Take 1/2 to 1 tablet by mouth 1-2 times daily    . atenolol (TENORMIN) 50 MG tablet Take 50 mg by mouth daily.    . Calcium Citrate-Vitamin D (CALCIUM CITRATE + PO) Take 2 tablets by mouth daily.    Marland Kitchen docusate sodium (COLACE) 100 MG capsule Take 100 mg by mouth daily as needed for mild constipation.    . gabapentin (NEURONTIN) 100 MG capsule Take 100 mg by mouth at bedtime.    Marland Kitchen lisinopril (PRINIVIL,ZESTRIL) 10 MG tablet Take 10 mg by mouth daily.    . sertraline  (ZOLOFT) 25 MG tablet Take 25 mg by mouth daily.     No facility-administered medications prior to visit.    No Known Allergies  Review of Systems  Constitutional: Negative for fever, chills and malaise/fatigue.  HENT: Negative for congestion and hearing loss.   Eyes: Negative for discharge.  Respiratory: Negative for cough, sputum production and shortness of breath.   Cardiovascular: Negative for chest pain, palpitations and leg swelling.  Gastrointestinal: Negative for heartburn, nausea, vomiting, abdominal pain, diarrhea, constipation and blood in stool.  Genitourinary: Negative for dysuria, urgency, frequency and hematuria.  Musculoskeletal: Negative for myalgias, back pain and falls.  Skin: Negative for rash.  Neurological: Negative for dizziness, sensory change, loss of consciousness, weakness and headaches.  Endo/Heme/Allergies: Negative for environmental allergies. Does not bruise/bleed easily.  Psychiatric/Behavioral: Negative for depression and suicidal ideas. The patient is nervous/anxious. The patient does not have insomnia.        Objective:    Physical Exam  Constitutional: She is oriented to person, place, and time. She appears well-developed and well-nourished. No distress.  HENT:  Head: Normocephalic and atraumatic.  Eyes: Conjunctivae are normal.  Neck: Neck supple. No thyromegaly present.  Cardiovascular: Normal rate, regular rhythm and normal heart sounds.   No  murmur heard. Pulmonary/Chest: Effort normal and breath sounds normal. No respiratory distress.  Abdominal: Soft. Bowel sounds are normal. She exhibits no distension and no mass. There is no tenderness.  Musculoskeletal: She exhibits no edema.  Lymphadenopathy:    She has no cervical adenopathy.  Neurological: She is alert and oriented to person, place, and time.  Skin: Skin is warm and dry.  Psychiatric: She has a normal mood and affect. Her behavior is normal.    Pulse 65  Temp(Src) 98.2 F  (36.8 C) (Oral)  Ht 4' 11.5" (1.511 m)  Wt 96 lb 4 oz (43.659 kg)  BMI 19.12 kg/m2  SpO2 98% Wt Readings from Last 3 Encounters:  04/05/15 96 lb 4 oz (43.659 kg)     Lab Results  Component Value Date   WBC 4.1 05/09/2008   HGB 13.8 05/09/2008   HCT 41.0 05/09/2008   PLT 149* 05/09/2008   GLUCOSE 110* 05/09/2008   ALT 16 05/09/2008   AST 20 05/09/2008   NA 140 05/09/2008   K 4.1 05/09/2008   CL 107 05/09/2008   CREATININE 0.82 05/09/2008   BUN 17 05/09/2008   CO2 27 05/09/2008    No results found for: TSH Lab Results  Component Value Date   WBC 4.1 05/09/2008   HGB 13.8 05/09/2008   HCT 41.0 05/09/2008   MCV 89.5 05/09/2008   PLT 149* 05/09/2008   Lab Results  Component Value Date   NA 140 05/09/2008   K 4.1 05/09/2008   CO2 27 05/09/2008   GLUCOSE 110* 05/09/2008   BUN 17 05/09/2008   CREATININE 0.82 05/09/2008   BILITOT 0.7 05/09/2008   ALKPHOS 63 05/09/2008   AST 20 05/09/2008   ALT 16 05/09/2008   PROT 6.5 05/09/2008   ALBUMIN 3.6 05/09/2008   CALCIUM 9.6 05/09/2008     Assessment & Plan:  Hypertension Well controlled, no changes to meds. Encouraged heart healthy diet such as the DASH diet and exercise as tolerated.   Anxiety Is not sure she feels she needs Sertraline and she does not think it is making much of a difference but she is willing to continue for now  Hyperlipidemia Encouraged heart healthy diet, increase exercise, avoid trans fats, consider a krill oil cap daily  Leukopenia Mild, no concerning symptoms.   patient declines colonoscopy, MGM, paps and Dexa scans  I am having [name] maintain her ALPRAZolam, gabapentin, sertraline, lisinopril, atenolol, Calcium Citrate-Vitamin D (CALCIUM CITRATE + PO), and docusate sodium.  No orders of the defined types were placed in this encounter.     Penni Homans, MD

## 2015-04-05 NOTE — Patient Instructions (Signed)
Minimize sodium and caffeine Hypertension Hypertension, commonly called high blood pressure, is when the force of blood pumping through your arteries is too strong. Your arteries are the blood vessels that carry blood from your heart throughout your body. A blood pressure reading consists of a higher number over a lower number, such as 110/72. The higher number (systolic) is the pressure inside your arteries when your heart pumps. The lower number (diastolic) is the pressure inside your arteries when your heart relaxes. Ideally you want your blood pressure below 120/80. Hypertension forces your heart to work harder to pump blood. Your arteries may become narrow or stiff. Having untreated or uncontrolled hypertension can cause heart attack, stroke, kidney disease, and other problems. RISK FACTORS Some risk factors for high blood pressure are controllable. Others are not.  Risk factors you cannot control include:   Race. You may be at higher risk if you are African American.  Age. Risk increases with age.  Gender. Men are at higher risk than women before age 48 years. After age 74, women are at higher risk than men. Risk factors you can control include:  Not getting enough exercise or physical activity.  Being overweight.  Getting too much fat, sugar, calories, or salt in your diet.  Drinking too much alcohol. SIGNS AND SYMPTOMS Hypertension does not usually cause signs or symptoms. Extremely high blood pressure (hypertensive crisis) may cause headache, anxiety, shortness of breath, and nosebleed. DIAGNOSIS To check if you have hypertension, your health care provider will measure your blood pressure while you are seated, with your arm held at the level of your heart. It should be measured at least twice using the same arm. Certain conditions can cause a difference in blood pressure between your right and left arms. A blood pressure reading that is higher than normal on one occasion does not  mean that you need treatment. If it is not clear whether you have high blood pressure, you may be asked to return on a different day to have your blood pressure checked again. Or, you may be asked to monitor your blood pressure at home for 1 or more weeks. TREATMENT Treating high blood pressure includes making lifestyle changes and possibly taking medicine. Living a healthy lifestyle can help lower high blood pressure. You may need to change some of your habits. Lifestyle changes may include:  Following the DASH diet. This diet is high in fruits, vegetables, and whole grains. It is low in salt, red meat, and added sugars.  Keep your sodium intake below 2,300 mg per day.  Getting at least 30-45 minutes of aerobic exercise at least 4 times per week.  Losing weight if necessary.  Not smoking.  Limiting alcoholic beverages.  Learning ways to reduce stress. Your health care provider may prescribe medicine if lifestyle changes are not enough to get your blood pressure under control, and if one of the following is true:  You are 20-7 years of age and your systolic blood pressure is above 140.  You are 46 years of age or older, and your systolic blood pressure is above 150.  Your diastolic blood pressure is above 90.  You have diabetes, and your systolic blood pressure is over 678 or your diastolic blood pressure is over 90.  You have kidney disease and your blood pressure is above 140/90.  You have heart disease and your blood pressure is above 140/90. Your personal target blood pressure may vary depending on your medical conditions, your age, and other  factors. HOME CARE INSTRUCTIONS  Have your blood pressure rechecked as directed by your health care provider.   Take medicines only as directed by your health care provider. Follow the directions carefully. Blood pressure medicines must be taken as prescribed. The medicine does not work as well when you skip doses. Skipping doses also  puts you at risk for problems.  Do not smoke.   Monitor your blood pressure at home as directed by your health care provider. SEEK MEDICAL CARE IF:   You think you are having a reaction to medicines taken.  You have recurrent headaches or feel dizzy.  You have swelling in your ankles.  You have trouble with your vision. SEEK IMMEDIATE MEDICAL CARE IF:  You develop a severe headache or confusion.  You have unusual weakness, numbness, or feel faint.  You have severe chest or abdominal pain.  You vomit repeatedly.  You have trouble breathing. MAKE SURE YOU:   Understand these instructions.  Will watch your condition.  Will get help right away if you are not doing well or get worse.   This information is not intended to replace advice given to you by your health care provider. Make sure you discuss any questions you have with your health care provider.   Document Released: 06/16/2005 Document Revised: 10/31/2014 Document Reviewed: 04/08/2013 Elsevier Interactive Patient Education [employer].

## 2015-04-06 LAB — COMPREHENSIVE METABOLIC PANEL
ALBUMIN: 4.3 g/dL (ref 3.5–5.2)
ALT: 18 U/L (ref 0–35)
AST: 25 U/L (ref 0–37)
Alkaline Phosphatase: 78 U/L (ref 39–117)
BUN: 15 mg/dL (ref 6–23)
CHLORIDE: 103 meq/L (ref 96–112)
CO2: 32 mEq/L (ref 19–32)
CREATININE: 0.81 mg/dL (ref 0.40–1.20)
Calcium: 9.7 mg/dL (ref 8.4–10.5)
GFR: 71.18 mL/min (ref >60.00)
Glucose, Bld: 89 mg/dL (ref 70–99)
Potassium: 4.2 mEq/L (ref 3.5–5.1)
SODIUM: 142 meq/L (ref 135–145)
TOTAL PROTEIN: 7.2 g/dL (ref 6.0–8.3)
Total Bilirubin: 0.6 mg/dL (ref 0.2–1.2)

## 2015-04-06 LAB — LIPID PANEL
CHOLESTEROL: 213 mg/dL — AB (ref 0–200)
HDL: 65.3 mg/dL (ref >39.00)
LDL CALC: 123 mg/dL — AB (ref 0–99)
NONHDL: 147.36
Total CHOL/HDL Ratio: 3
Triglycerides: 121 mg/dL (ref 0.0–149.0)
VLDL: 24.2 mg/dL (ref 0.0–40.0)

## 2015-04-06 LAB — TSH: TSH: 1.64 u[IU]/mL (ref 0.35–4.50)

## 2015-04-06 LAB — CBC
HCT: 43.7 % (ref 36.0–46.0)
Hemoglobin: 14.4 g/dL (ref 12.0–15.0)
MCHC: 33 g/dL (ref 30.0–36.0)
MCV: 90.8 fl (ref 78.0–100.0)
Platelets: 155 10*3/uL (ref 150.0–400.0)
RBC: 4.81 Mil/uL (ref 3.87–5.11)
RDW: 14.9 % (ref 11.5–15.5)
WBC: 3.8 10*3/uL — AB (ref 4.0–10.5)

## 2015-04-08 ENCOUNTER — Encounter: Payer: Self-pay | Admitting: Family Medicine

## 2015-04-08 DIAGNOSIS — E785 Hyperlipidemia, unspecified: Secondary | ICD-10-CM | POA: Insufficient documentation

## 2015-04-08 DIAGNOSIS — F419 Anxiety disorder, unspecified: Secondary | ICD-10-CM | POA: Insufficient documentation

## 2015-04-08 DIAGNOSIS — I10 Essential (primary) hypertension: Secondary | ICD-10-CM | POA: Insufficient documentation

## 2015-04-08 DIAGNOSIS — D72819 Decreased white blood cell count, unspecified: Secondary | ICD-10-CM

## 2015-04-08 HISTORY — DX: Decreased white blood cell count, unspecified: D72.819

## 2015-04-08 NOTE — Assessment & Plan Note (Signed)
Well controlled, no changes to meds. Encouraged heart healthy diet such as the DASH diet and exercise as tolerated.  °

## 2015-04-08 NOTE — Assessment & Plan Note (Signed)
Encouraged heart healthy diet, increase exercise, avoid trans fats, consider a krill oil cap daily 

## 2015-04-08 NOTE — Assessment & Plan Note (Signed)
Is not sure she feels she needs Sertraline and she does not think it is making much of a difference but she is willing to continue for now

## 2015-04-08 NOTE — Assessment & Plan Note (Signed)
Mild, no concerning symptoms.

## 2015-04-12 ENCOUNTER — Telehealth: Payer: Self-pay

## 2015-04-12 ENCOUNTER — Encounter: Payer: Self-pay | Admitting: Medical

## 2015-04-12 ENCOUNTER — Ambulatory Visit (INDEPENDENT_AMBULATORY_CARE_PROVIDER_SITE_OTHER): Payer: Medicare Other | Admitting: Medical

## 2015-04-12 VITALS — BP 120/80 | HR 68 | Temp 97.8°F | Ht 59.5 in | Wt 93.8 lb

## 2015-04-12 DIAGNOSIS — I4891 Unspecified atrial fibrillation: Secondary | ICD-10-CM | POA: Diagnosis not present

## 2015-04-12 DIAGNOSIS — I480 Paroxysmal atrial fibrillation: Secondary | ICD-10-CM | POA: Diagnosis not present

## 2015-04-12 LAB — TROPONIN I

## 2015-04-12 NOTE — Patient Instructions (Addendum)
You appear to have had brief episode of paroxysmal atrial fibrillation this am. You were not symptomatic. Your pulse stayed in 60 range in office. Your ekg looked good.  I would continue on your current med regimen. I offered you referral to cardiologist(non emergent) but you refused. If you change your mind let us know.  Follow up as regularly scheduled with pcp or as needed with me/other provider in office.

## 2015-04-12 NOTE — Progress Notes (Signed)
Pre visit review using our clinic review tool, if applicable. No additional management support is needed unless otherwise documented below in the visit note. 

## 2015-04-12 NOTE — Progress Notes (Signed)
Subjective:    Patient ID: Peggy Peterson, female    DOB: [birth year], 81 y.o.   MRN: [id number]  HPI  Pt states this morning she thought she could not feel her pulse this morning. Pt has hx of Paroxysmal atrial fibrillation. Pt is on atenolol and lisinopril.   Pt states she has hx of palpitations and this occurs very rarely. At most every 6 months. This may have lasted total 45 minutes. Did not have chest pain, no sob.   Pt tsh recently very good(cbc also normal recently). No fevers, no chills and not cough. Then pt states some sensation of feeling warmth recently.  Pt is not a smoker. But she used to smoke.      Review of Systems  Constitutional: Negative for fever, chills, diaphoresis, activity change and fatigue.  Respiratory: Negative for cough, chest tightness and shortness of breath.   Cardiovascular: Positive for palpitations. Negative for chest pain and leg swelling.       None now. But earlier when checked pulse noted abnormal.  Gastrointestinal: Negative for nausea, vomiting and abdominal pain.  Musculoskeletal: Negative for neck pain and neck stiffness.  Neurological: Negative for dizziness, tremors, seizures, syncope, facial asymmetry, speech difficulty, weakness, light-headedness, numbness and headaches.  Psychiatric/Behavioral: Negative for behavioral problems, confusion and agitation. The patient is not nervous/anxious.     Past Medical History  Diagnosis Date  . History of chicken pox   . H/O measles   . H/O mumps   . Paroxysmal atrial fibrillation ([place]) 04/05/2015  . Anxiety   . Hyperlipidemia   . Hypertension   . Leukopenia 04/08/2015    Social History   Social History  . Marital Status: Married    Spouse Name: N/A  . Number of Children: N/A  . Years of Education: N/A   Occupational History  . retired     Social History Main Topics  . Smoking status: Former Research scientist (life sciences)  . Smokeless tobacco: Not on file     Comment: Stopper smoking in 1960  . Alcohol  Use: No  . Drug Use: No  . Sexual Activity: No     Comment: lives with husband, no dietary restrictions.   Other Topics Concern  . Not on file   Social History Narrative    Past Surgical History  Procedure Laterality Date  . Abdominal hysterectomy      Family History  Problem Relation Age of Onset  . Cancer Mother 16    leukemia   . Diabetes Father   . Cancer Father   . Dementia Brother   . Hip fracture Brother     No Known Allergies  Current Outpatient Prescriptions on File Prior to Visit  Medication Sig Dispense Refill  . ALPRAZolam (XANAX) 1 MG tablet Take 1 mg by mouth at bedtime as needed for anxiety. Take 1/2 to 1 tablet by mouth 1-2 times daily    . atenolol (TENORMIN) 50 MG tablet Take 1 tablet (50 mg total) by mouth daily. 90 tablet 2  . Calcium Citrate-Vitamin D (CALCIUM CITRATE + PO) Take 2 tablets by mouth daily.    Marland Kitchen docusate sodium (COLACE) 100 MG capsule Take 100 mg by mouth daily as needed for mild constipation.    . gabapentin (NEURONTIN) 100 MG capsule Take 100 mg by mouth at bedtime.    Marland Kitchen lisinopril (PRINIVIL,ZESTRIL) 10 MG tablet Take 1 tablet (10 mg total) by mouth daily. 90 tablet 2  . sertraline (ZOLOFT) 25 MG tablet Take 1 tablet (  25 mg total) by mouth daily. 90 tablet 2   No current facility-administered medications on file prior to visit.    BP 120/80 mmHg  Pulse 68  Temp(Src) 97.8 F (36.6 C) (Oral)  Ht 4' 11.5" (1.511 m)  Wt 93 lb 12.8 oz (42.547 kg)  BMI 18.64 kg/m2  SpO2 98%       Objective:   Physical Exam  General Mental Status- Alert. General Appearance- Not in acute distress.   Skin General: Color- Normal Color. Moisture- Normal Moisture.  Neck Carotid Arteries- Normal color. Moisture- Normal Moisture. No carotid bruits. No JVD.  Chest and Lung Exam Auscultation: Breath Sounds:-Normal.  Cardiovascular Auscultation:Rythm- Regular. Murmurs & Other Heart Sounds:Auscultation of the heart reveals- No  Murmurs.  Abdomen Inspection:-Inspeection Normal. Palpation/Percussion:Note:No mass. Palpation and Percussion of the abdomen reveal- Non Tender, Non Distended + BS, no rebound or guarding.    Neurologic Cranial Nerve exam:- CN III-XII intact(No nystagmus), symmetric smile. Strength:- 5/5 equal and symmetric strength both upper and lower extremities.      Assessment & Plan:  You appear to have had brief episode of paroxysmal atrial fibrillation this am. You were not symptomatic. Your pulse stayed in 60 range in office. Your ekg looked good.  I would continue on your current med regimen. I offered you referral to cardiologist(non emergent) but you refused. If you change your mind let us know.  Follow up as regularly scheduled with pcp or as needed with me/other provider in office  Did add troponin to lab today. No prior in computer to compare. v1 and v2 looked mild negative in t wave. 5-6 hours since atrial fib event. Will follow troponin. Expect negtive.

## 2015-04-12 NOTE — Telephone Encounter (Signed)
Patient called in this am. States she woke up this morning with her pulse all over the place. Asked patient what her pulse was  Stated she did not know. Says she has a history of A-fib and didn't want to take any chances. Asked patient if she was having any SOB or chest pain. Patient states she is not. States she just wants to be checked out. Appointment scheduled for patient today at 11:15 with E. Saguier.

## 2015-04-17 ENCOUNTER — Ambulatory Visit: Payer: Self-pay | Admitting: Family Medicine

## 2015-06-12 ENCOUNTER — Other Ambulatory Visit: Payer: Self-pay | Admitting: Family Medicine

## 2015-06-12 MED ORDER — GABAPENTIN 100 MG PO CAPS: 100.0000 mg | ORAL_CAPSULE | Freq: Every day | ORAL | Status: DC

## 2015-06-12 MED ORDER — ALPRAZOLAM 1 MG PO TABS: 1.0000 mg | ORAL_TABLET | Freq: Every evening | ORAL | Status: DC | PRN

## 2015-06-12 NOTE — Telephone Encounter (Signed)
Pharmacy: [facility] [phone] [place], [place] [address]  Reason for call: Pt needing refill on alprazolam. Has enough til Thursday. Pt needing refill on gabapentin. She has enough til the weekend.

## 2015-06-12 NOTE — Telephone Encounter (Signed)
Faxed hardcopy to [facility].  Called the patient informed both request done.

## 2015-06-12 NOTE — Telephone Encounter (Signed)
Updated medication list

## 2015-07-06 ENCOUNTER — Ambulatory Visit: Payer: PRIVATE HEALTH INSURANCE | Admitting: Family Medicine

## 2015-07-10 ENCOUNTER — Encounter: Payer: Self-pay | Admitting: Family Medicine

## 2015-07-10 ENCOUNTER — Ambulatory Visit (INDEPENDENT_AMBULATORY_CARE_PROVIDER_SITE_OTHER): Payer: Medicare Other | Admitting: Family Medicine

## 2015-07-10 VITALS — BP 108/72 | HR 85 | Temp 98.0°F | Ht 60.0 in | Wt 98.2 lb

## 2015-07-10 DIAGNOSIS — F419 Anxiety disorder, unspecified: Secondary | ICD-10-CM

## 2015-07-10 DIAGNOSIS — I10 Essential (primary) hypertension: Secondary | ICD-10-CM | POA: Diagnosis not present

## 2015-07-10 DIAGNOSIS — E785 Hyperlipidemia, unspecified: Secondary | ICD-10-CM

## 2015-07-10 DIAGNOSIS — Z23 Encounter for immunization: Secondary | ICD-10-CM | POA: Diagnosis not present

## 2015-07-10 MED ORDER — CEFDINIR 300 MG PO CAPS: 300.0000 mg | ORAL_CAPSULE | Freq: Two times a day (BID) | ORAL | Status: DC

## 2015-07-10 MED ORDER — HYDROCODONE-HOMATROPINE 5-1.5 MG/5ML PO SYRP: 5.0000 mL | ORAL_SOLUTION | Freq: Four times a day (QID) | ORAL | Status: DC | PRN

## 2015-07-10 NOTE — Assessment & Plan Note (Signed)
Encouraged heart healthy diet, increase exercise, avoid trans fats, consider a krill oil cap daily 

## 2015-07-10 NOTE — Progress Notes (Signed)
Patient ID: Peggy Peterson, female   DOB: [birth year], 82 y.o.   MRN: [id number]   Subjective:    Patient ID: Peggy Peterson, female    DOB: [birth year], 82 y.o.   MRN: [id number]  Chief Complaint  Patient presents with  . Follow-up    HPI Patient is in today for follow-up. She is feeling well. She has been struggling with her husband's acute illness but is managed well with her current medications. No new or acute illness noted today. Denies CP/palp/SOB/HA/congestion/fevers/GI or GU c/o. Taking meds as prescribed Past Medical History  Diagnosis Date  . History of chicken pox   . H/O measles   . H/O mumps   . Paroxysmal atrial fibrillation ([place]) 04/05/2015  . Anxiety   . Hyperlipidemia   . Hypertension   . Leukopenia 04/08/2015    Past Surgical History  Procedure Laterality Date  . Abdominal hysterectomy      Family History  Problem Relation Age of Onset  . Cancer Mother 29    leukemia   . Diabetes Father   . Cancer Father   . Dementia Brother   . Hip fracture Brother     Social History   Social History  . Marital Status: Married    Spouse Name: N/A  . Number of Children: N/A  . Years of Education: N/A   Occupational History  . retired     Social History Main Topics  . Smoking status: Former Research scientist (life sciences)  . Smokeless tobacco: Not on file     Comment: Stopper smoking in 1960  . Alcohol Use: No  . Drug Use: No  . Sexual Activity: No     Comment: lives with husband, no dietary restrictions.   Other Topics Concern  . Not on file   Social History Narrative    Outpatient Prescriptions Prior to Visit  Medication Sig Dispense Refill  . ALPRAZolam (XANAX) 1 MG tablet Take 1/2 to 1 tablet by mouth 1-2 times daily as needed.    Marland Kitchen atenolol (TENORMIN) 50 MG tablet Take 1 tablet (50 mg total) by mouth daily. 90 tablet 2  . Calcium Citrate-Vitamin D (CALCIUM CITRATE + PO) Take 2 tablets by mouth daily.    Marland Kitchen docusate sodium (COLACE) 100 MG capsule Take 100 mg by mouth daily as  needed for mild constipation.    . gabapentin (NEURONTIN) 100 MG capsule Take 1 capsule (100 mg total) by mouth at bedtime. 30 capsule 5  . lisinopril (PRINIVIL,ZESTRIL) 10 MG tablet Take 1 tablet (10 mg total) by mouth daily. 90 tablet 2  . sertraline (ZOLOFT) 25 MG tablet Take 1 tablet (25 mg total) by mouth daily. 90 tablet 2   No facility-administered medications prior to visit.    No Known Allergies  Review of Systems  Constitutional: Negative for fever and malaise/fatigue.  HENT: Negative for congestion.   Eyes: Negative for discharge.  Respiratory: Negative for shortness of breath.   Cardiovascular: Negative for chest pain, palpitations and leg swelling.       Intermittent atrial fib.  Gastrointestinal: Negative for nausea and abdominal pain.  Genitourinary: Negative for dysuria.  Musculoskeletal: Negative for falls.  Skin: Negative for rash.  Neurological: Negative for loss of consciousness and headaches.  Endo/Heme/Allergies: Negative for environmental allergies.  Psychiatric/Behavioral: Negative for depression. The patient is not nervous/anxious.        Objective:    Physical Exam  Constitutional: She is oriented to person, place, and time. She appears well-developed and well-nourished. No  distress.  HENT:  Head: Normocephalic and atraumatic.  Nose: Nose normal.  Eyes: Right eye exhibits no discharge. Left eye exhibits no discharge.  Neck: Normal range of motion. Neck supple.  Cardiovascular: Normal rate and regular rhythm.   No murmur heard. Pulmonary/Chest: Effort normal and breath sounds normal.  Abdominal: Soft. Bowel sounds are normal. There is no tenderness.  Musculoskeletal: She exhibits no edema.  Neurological: She is alert and oriented to person, place, and time.  Skin: Skin is warm and dry.  Psychiatric: She has a normal mood and affect.  Nursing note and vitals reviewed.   BP 108/72 mmHg  Pulse 85  Temp(Src) 98 F (36.7 C) (Oral)  Ht 5' (1.524  m)  Wt 98 lb 4 oz (44.566 kg)  BMI 19.19 kg/m2  SpO2 96% Wt Readings from Last 3 Encounters:  07/10/15 98 lb 4 oz (44.566 kg)  04/12/15 93 lb 12.8 oz (42.547 kg)  04/05/15 96 lb 4 oz (43.659 kg)     Lab Results  Component Value Date   WBC 3.8* 04/05/2015   HGB 14.4 04/05/2015   HCT 43.7 04/05/2015   PLT 155.0 04/05/2015   GLUCOSE 89 04/05/2015   CHOL 213* 04/05/2015   TRIG 121.0 04/05/2015   HDL 65.30 04/05/2015   LDLCALC 123* 04/05/2015   ALT 18 04/05/2015   AST 25 04/05/2015   NA 142 04/05/2015   K 4.2 04/05/2015   CL 103 04/05/2015   CREATININE 0.81 04/05/2015   BUN 15 04/05/2015   CO2 32 04/05/2015   TSH 1.64 04/05/2015    Lab Results  Component Value Date   TSH 1.64 04/05/2015   Lab Results  Component Value Date   WBC 3.8* 04/05/2015   HGB 14.4 04/05/2015   HCT 43.7 04/05/2015   MCV 90.8 04/05/2015   PLT 155.0 04/05/2015   Lab Results  Component Value Date   NA 142 04/05/2015   K 4.2 04/05/2015   CO2 32 04/05/2015   GLUCOSE 89 04/05/2015   BUN 15 04/05/2015   CREATININE 0.81 04/05/2015   BILITOT 0.6 04/05/2015   ALKPHOS 78 04/05/2015   AST 25 04/05/2015   ALT 18 04/05/2015   PROT 7.2 04/05/2015   ALBUMIN 4.3 04/05/2015   CALCIUM 9.7 04/05/2015   GFR 71.18 04/05/2015   Lab Results  Component Value Date   CHOL 213* 04/05/2015   Lab Results  Component Value Date   HDL 65.30 04/05/2015   Lab Results  Component Value Date   LDLCALC 123* 04/05/2015   Lab Results  Component Value Date   TRIG 121.0 04/05/2015   Lab Results  Component Value Date   CHOLHDL 3 04/05/2015   No results found for: HGBA1C     Assessment & Plan:   Problem List Items Addressed This Visit    Anxiety    Good response to Sertraline, using alprazolam infrequently      Hyperlipidemia - Primary    Encouraged heart healthy diet, increase exercise, avoid trans fats, consider a krill oil cap daily      Hypertension    Well controlled, no changes to meds.  Encouraged heart healthy diet such as the DASH diet and exercise as tolerated.        Other Visit Diagnoses    Need for prophylactic vaccination against Streptococcus pneumoniae (pneumococcus)        Relevant Orders    Pneumococcal conjugate vaccine 13-valent (Completed)       I have discontinued Ms. Scarbrough's HYDROcodone-homatropine and  cefdinir. I am also having her maintain her Calcium Citrate-Vitamin D (CALCIUM CITRATE + PO), docusate sodium, lisinopril, atenolol, sertraline, gabapentin, and ALPRAZolam.  Meds ordered this encounter  Medications  . DISCONTD: HYDROcodone-homatropine (HYCODAN) 5-1.5 MG/5ML syrup    Sig: Take 5 mLs by mouth every 6 (six) hours as needed for cough.    Dispense:  120 mL    Refill:  0  . DISCONTD: cefdinir (OMNICEF) 300 MG capsule    Sig: Take 1 capsule (300 mg total) by mouth 2 (two) times daily.    Dispense:  20 capsule    Refill:  0     Penni Homans, MD

## 2015-07-10 NOTE — Progress Notes (Signed)
Pre visit review using our clinic review tool, if applicable. No additional management support is needed unless otherwise documented below in the visit note. 

## 2015-07-10 NOTE — Patient Instructions (Addendum)
Hypertension Hypertension, commonly called high blood pressure, is when the force of blood pumping through your arteries is too strong. Your arteries are the blood vessels that carry blood from your heart throughout your body. A blood pressure reading consists of a higher number over a lower number, such as 110/72. The higher number (systolic) is the pressure inside your arteries when your heart pumps. The lower number (diastolic) is the pressure inside your arteries when your heart relaxes. Ideally you want your blood pressure below 120/80. Hypertension forces your heart to work harder to pump blood. Your arteries may become narrow or stiff. Having untreated or uncontrolled hypertension can cause heart attack, stroke, kidney disease, and other problems. RISK FACTORS Some risk factors for high blood pressure are controllable. Others are not.  Risk factors you cannot control include:   Race. You may be at higher risk if you are African American.  Age. Risk increases with age.  Gender. Men are at higher risk than women before age 45 years. After age 65, women are at higher risk than men. Risk factors you can control include:  Not getting enough exercise or physical activity.  Being overweight.  Getting too much fat, sugar, calories, or salt in your diet.  Drinking too much alcohol. SIGNS AND SYMPTOMS Hypertension does not usually cause signs or symptoms. Extremely high blood pressure (hypertensive crisis) may cause headache, anxiety, shortness of breath, and nosebleed. DIAGNOSIS To check if you have hypertension, your health care provider will measure your blood pressure while you are seated, with your arm held at the level of your heart. It should be measured at least twice using the same arm. Certain conditions can cause a difference in blood pressure between your right and left arms. A blood pressure reading that is higher than normal on one occasion does not mean that you need treatment. If  it is not clear whether you have high blood pressure, you may be asked to return on a different day to have your blood pressure checked again. Or, you may be asked to monitor your blood pressure at home for 1 or more weeks. TREATMENT Treating high blood pressure includes making lifestyle changes and possibly taking medicine. Living a healthy lifestyle can help lower high blood pressure. You may need to change some of your habits. Lifestyle changes may include:  Following the DASH diet. This diet is high in fruits, vegetables, and whole grains. It is low in salt, red meat, and added sugars.  Keep your sodium intake below 2,300 mg per day.  Getting at least 30-45 minutes of aerobic exercise at least 4 times per week.  Losing weight if necessary.  Not smoking.  Limiting alcoholic beverages.  Learning ways to reduce stress. Your health care provider may prescribe medicine if lifestyle changes are not enough to get your blood pressure under control, and if one of the following is true:  You are 18-59 years of age and your systolic blood pressure is above 140.  You are 60 years of age or older, and your systolic blood pressure is above 150.  Your diastolic blood pressure is above 90.  You have diabetes, and your systolic blood pressure is over 140 or your diastolic blood pressure is over 90.  You have kidney disease and your blood pressure is above 140/90.  You have heart disease and your blood pressure is above 140/90. Your personal target blood pressure may vary depending on your medical conditions, your age, and other factors. HOME CARE INSTRUCTIONS    Have your blood pressure rechecked as directed by your health care provider.   Take medicines only as directed by your health care provider. Follow the directions carefully. Blood pressure medicines must be taken as prescribed. The medicine does not work as well when you skip doses. Skipping doses also puts you at risk for  problems.  Do not smoke.   Monitor your blood pressure at home as directed by your health care provider. SEEK MEDICAL CARE IF:   You think you are having a reaction to medicines taken.  You have recurrent headaches or feel dizzy.  You have swelling in your ankles.  You have trouble with your vision. SEEK IMMEDIATE MEDICAL CARE IF:  You develop a severe headache or confusion.  You have unusual weakness, numbness, or feel faint.  You have severe chest or abdominal pain.  You vomit repeatedly.  You have trouble breathing. MAKE SURE YOU:   Understand these instructions.  Will watch your condition.  Will get help right away if you are not doing well or get worse.   This information is not intended to replace advice given to you by your health care provider. Make sure you discuss any questions you have with your health care provider.   Document Released: 06/16/2005 Document Revised: 10/31/2014 Document Reviewed: 04/08/2013 Elsevier Interactive Patient Education 2016 [employer]  

## 2015-07-22 NOTE — Assessment & Plan Note (Signed)
Good response to Sertraline, using alprazolam infrequently

## 2015-07-22 NOTE — Assessment & Plan Note (Signed)
Well controlled, no changes to meds. Encouraged heart healthy diet such as the DASH diet and exercise as tolerated.  °

## 2015-07-30 ENCOUNTER — Telehealth: Payer: Self-pay | Admitting: Family Medicine

## 2015-07-30 NOTE — Telephone Encounter (Signed)
error 

## 2015-08-10 ENCOUNTER — Other Ambulatory Visit: Payer: Self-pay | Admitting: Family Medicine

## 2015-08-10 MED ORDER — ALPRAZOLAM 1 MG PO TABS: 0.5000 mg | ORAL_TABLET | Freq: Two times a day (BID) | ORAL | Status: DC | PRN

## 2015-08-10 NOTE — Addendum Note (Signed)
Addended by: Sharon Seller B on: 08/10/2015 02:12 PM   Modules accepted: Orders

## 2015-08-10 NOTE — Telephone Encounter (Signed)
[facility]

## 2015-08-10 NOTE — Telephone Encounter (Signed)
Faxed hardcopy for Alprazolam to  

## 2015-08-10 NOTE — Telephone Encounter (Signed)
Relation to pt: self °Call back number: [phone] ° ° °Reason for call:  °Patient requesting a refill ALPRAZolam (XANAX) 1 MG tablet  ° °

## 2015-08-10 NOTE — Telephone Encounter (Signed)
Reprinted hardcopy as first one has 2 sig's and pharmacy requested one sig to clarify Reprinted and on counter for signature by pcp.

## 2015-08-22 ENCOUNTER — Telehealth: Payer: Self-pay | Admitting: Behavioral Health

## 2015-08-22 NOTE — Telephone Encounter (Signed)
Patient voiced that she had just taken sertraline and noticed that her throat began to burn. Also, she addressed that she drunk warm to almost hot water with the medicine and had never done this before. Patient stated, "I immediately drunk cold water afterwards and the burning began to ease up." She inquired about whether burning of the throat was a side effect or not of the medication. Writer informed the patient that swelling or tingling in the mouth or throat is a possible allergic reaction of this medication, however it appears or seems that in her situation the temperature of the water was too hot and caused her throat to burn at that time. Currently, the patient does not report any difficult breathing, tightness in chest or throat/mouth pain. She stated, "since the time I've been speaking with you, I'm not experiencing any more burning in my throat"; "I'm be sure not to ever do that again"; she laughs afterwards. Advised the patient that if she begins to have any symptoms to call our office or go to an urgent care/ER. Patient understood and did not have any further questions or concerns.

## 2015-09-23 ENCOUNTER — Other Ambulatory Visit: Payer: Self-pay | Admitting: Family Medicine

## 2015-10-09 ENCOUNTER — Other Ambulatory Visit: Payer: Self-pay | Admitting: Family Medicine

## 2015-10-11 ENCOUNTER — Telehealth: Payer: Self-pay | Admitting: Family Medicine

## 2015-10-11 MED ORDER — ALPRAZOLAM 1 MG PO TABS: 0.5000 mg | ORAL_TABLET | Freq: Two times a day (BID) | ORAL | Status: DC | PRN

## 2015-10-11 NOTE — Telephone Encounter (Signed)
Refill request for ALPRAZolam.   Pharmacy: [facility]: [phone]

## 2015-10-11 NOTE — Telephone Encounter (Signed)
Received request to RF xanax NCCSR- last fill of alprazolam was on 3/18- #30 She receives xanax on a regular basis, no other providers noted  Called in #30 no RF  Meds ordered this encounter  Medications  . ALPRAZolam (XANAX) 1 MG tablet    Sig: Take 0.5-1 tablets (0.5-1 mg total) by mouth 2 (two) times daily as needed for anxiety.    Dispense:  30 tablet    Refill:  0

## 2015-10-11 NOTE — Telephone Encounter (Signed)
Pt is requesting refill on Alprazolam. Dr. Frederik Pear Pt.  Last OV: 07/10/2015 Last Fill: 08/10/2015 #30 and 1RF  Pt sig: 1/2-1 tablet BID PRN UDS: None  Please advise.

## 2015-10-14 NOTE — Telephone Encounter (Signed)
OK to refill alprazolam if not refilled last week

## 2015-10-15 NOTE — Telephone Encounter (Signed)
Patients chart does indicate medication was filled last week.

## 2015-11-19 ENCOUNTER — Other Ambulatory Visit: Payer: Self-pay | Admitting: Family Medicine

## 2015-11-19 MED ORDER — ALPRAZOLAM 1 MG PO TABS: 0.5000 mg | ORAL_TABLET | Freq: Two times a day (BID) | ORAL | Status: DC | PRN

## 2015-11-19 NOTE — Telephone Encounter (Signed)
Caller name: Self  Can be reached: [phone]  Pharmacy:  [facility] [place] [place] [phone] (Phone) [phone] (Fax)        Reason for call: Request refill on ALPRAZolam Duanne Moron) 1 MG tablet [id number]

## 2015-11-19 NOTE — Addendum Note (Signed)
Addended by: Sharon Seller B on: 11/19/2015 01:25 PM   Modules accepted: Orders

## 2015-11-19 NOTE — Telephone Encounter (Signed)
Pt called in she says that the pharmacy says that she would need a PA for medication. Pt would like further assistance.   CB: [phone]

## 2015-11-19 NOTE — Telephone Encounter (Signed)
Last refill #30 with 0 refills on 10/11/2015 Last office visit 07/10/2015

## 2015-11-19 NOTE — Telephone Encounter (Signed)
Printed and patient picked up.

## 2015-11-21 NOTE — Telephone Encounter (Signed)
PA initiated on [email or web address] with insurance we have on file. A PA form was never received from the pharmacy. Awaiting determination. JG//[id number]

## 2015-12-10 ENCOUNTER — Other Ambulatory Visit: Payer: Self-pay | Admitting: Family Medicine

## 2015-12-10 MED ORDER — GABAPENTIN 100 MG PO CAPS
ORAL_CAPSULE | ORAL | Status: DC
Start: 2015-12-10 — End: 2016-11-11

## 2015-12-10 MED ORDER — ALPRAZOLAM 1 MG PO TABS: 0.5000 mg | ORAL_TABLET | Freq: Two times a day (BID) | ORAL | Status: DC | PRN

## 2015-12-10 NOTE — Telephone Encounter (Signed)
Caller name: Self  Can be reached: [phone]   Pharmacy:  [facility] [place] [place] [facility] [place] (Phone) [phone] (Fax)       Reason for call: Request refills on gabapentin (NEURONTIN) 100 MG capsule [id number] and ALPRAZolam (XANAX) 1 MG tablet [id number]

## 2015-12-10 NOTE — Telephone Encounter (Signed)
Last refill on Alprazolam #30 11/19/2015

## 2015-12-11 NOTE — Telephone Encounter (Signed)
Faxed hardcopy to pharmacy. 

## 2016-01-04 ENCOUNTER — Other Ambulatory Visit: Payer: Self-pay | Admitting: Family Medicine

## 2016-01-07 ENCOUNTER — Other Ambulatory Visit: Payer: Self-pay | Admitting: Family Medicine

## 2016-01-07 ENCOUNTER — Encounter: Payer: Self-pay | Admitting: Family Medicine

## 2016-01-07 ENCOUNTER — Ambulatory Visit (INDEPENDENT_AMBULATORY_CARE_PROVIDER_SITE_OTHER): Payer: Medicare Other | Admitting: Family Medicine

## 2016-01-07 DIAGNOSIS — F419 Anxiety disorder, unspecified: Secondary | ICD-10-CM | POA: Diagnosis not present

## 2016-01-07 DIAGNOSIS — I10 Essential (primary) hypertension: Secondary | ICD-10-CM

## 2016-01-07 DIAGNOSIS — E785 Hyperlipidemia, unspecified: Secondary | ICD-10-CM

## 2016-01-07 MED ORDER — LISINOPRIL 10 MG PO TABS: 10.0000 mg | ORAL_TABLET | Freq: Every day | ORAL | Status: DC

## 2016-01-07 MED ORDER — ATENOLOL 50 MG PO TABS: ORAL_TABLET | ORAL | Status: DC

## 2016-01-07 NOTE — Patient Instructions (Signed)
Complicated Grieving Grief is a normal response to the death of someone close to you. Feelings of fear, anger, and guilt can affect almost everyone who loses a loved one. It is also common to have symptoms of depression while you are grieving. These include problems with sleep, loss of appetite, and lack of energy. They may last for weeks or months after a loss. Complicated grief is different from normal grief or depression. Normal grieving involves sadness and feelings of loss, but these feelings are not constant. Complicated grief is a constant and severe type of grief. It interferes with your ability to function normally. It may last for several months to a year or longer. Complicated grief may require treatment from a mental health care provider. CAUSES  It is not known why some people continue to struggle with grief and others do not. You may be at higher risk for complicated grief if:  The death of your loved one was sudden or unexpected.  The death of your loved one was due to a violent event.  Your loved one committed suicide.  Your loved one was a child or a young person.  You were very close to or dependent on the loved one.  You have a history of depression. SIGNS AND SYMPTOMS Signs and symptoms of complicated grief may include:  Feeling disbelief or numbness.  Being unable to enjoy good memories of your loved one.  Needing to avoid anything that reminds you of your loved one.  Being unable to stop thinking about the death.  Feeling intense anger or guilt.  Feeling alone and hopeless.  Feeling that your life is meaningless and empty.  Losing the desire to live. DIAGNOSIS Your health care provider may diagnose complicated grief if:  You have constant symptoms of grief for 6-12 months or longer.  Your symptoms are interfering with your ability to live your life. Your health care provider may want you to see a mental health care provider. Many symptoms of depression  are similar to the symptoms of complicated grief. It is important to be evaluated for complicated grief along with other mental health conditions. TREATMENT  Talk therapy with a mental health provider is the most common treatment for complicated grief. During therapy, you will learn healthy ways to cope with the loss of your loved one. In some cases, your mental health care provider may also recommend antidepressant medicines. HOME CARE INSTRUCTIONS  Take care of yourself.  Eat regular meals and maintain a healthy diet. Eat plenty of fruits, vegetables, and whole grains.  Try to get some exercise each day.  Keep regular hours for sleep. Try to get at least 8 hours of sleep each night.  Do not use drugs or alcohol to ease your symptoms.  Take medicines only as directed by your health care provider.  Spend time with friends and loved ones.  Consider joining a grief (bereavement) support group to help you deal with your loss.  Keep all follow-up visits as directed by your health care provider. This is important. SEEK MEDICAL CARE IF:  Your symptoms keep you from functioning normally.  Your symptoms do not get better with treatment. SEEK IMMEDIATE MEDICAL CARE IF:  You have serious thoughts of hurting yourself or someone else.  You have suicidal feelings.   This information is not intended to replace advice given to you by your health care provider. Make sure you discuss any questions you have with your health care provider.   Document Released: 06/16/2005   Document Revised: 03/07/2015 Document Reviewed: 11/24/2013 Elsevier Interactive Patient Education 2016 Elsevier Inc.  

## 2016-01-07 NOTE — Progress Notes (Signed)
Pre visit review using our clinic review tool, if applicable. No additional management support is needed unless otherwise documented below in the visit note. 

## 2016-01-20 NOTE — Progress Notes (Signed)
Patient ID: Peggy Peterson, female   DOB: January 06, 1927, 82 y.o.   MRN: [id number]   Subjective:    Patient ID: VALIYAH FELIPE, female    DOB: [birth year], 82 y.o.   MRN: [id number]  Chief Complaint  Patient presents with  . Follow-up    HPI Patient is in today for follow up. She is grieving the loss of her son who passed away last month. No recent illness or hospitalizations. Denies CP/palp/SOB/HA/congestion/fevers/GI or GU c/o. Taking meds as prescribed  Past Medical History:  Diagnosis Date  . Anxiety   . H/O measles   . H/O mumps   . History of chicken pox   . Hyperlipidemia   . Hypertension   . Leukopenia 04/08/2015  . Paroxysmal atrial fibrillation ([place]) 04/05/2015    Past Surgical History:  Procedure Laterality Date  . ABDOMINAL HYSTERECTOMY      Family History  Problem Relation Age of Onset  . Cancer Mother 37    leukemia   . Diabetes Father   . Cancer Father   . Dementia Brother   . Hip fracture Brother     Social History   Social History  . Marital status: Married    Spouse name: N/A  . Number of children: N/A  . Years of education: N/A   Occupational History  . retired     Social History Main Topics  . Smoking status: Former Research scientist (life sciences)  . Smokeless tobacco: Not on file     Comment: Stopper smoking in 1960  . Alcohol use No  . Drug use: No  . Sexual activity: No     Comment: lives with husband, no dietary restrictions.   Other Topics Concern  . Not on file   Social History Narrative  . No narrative on file    Outpatient Medications Prior to Visit  Medication Sig Dispense Refill  . ALPRAZolam (XANAX) 1 MG tablet Take 0.5-1 tablets (0.5-1 mg total) by mouth 2 (two) times daily as needed for anxiety. 30 tablet 5  . Calcium Citrate-Vitamin D (CALCIUM CITRATE + PO) Take 2 tablets by mouth daily.    Marland Kitchen docusate sodium (COLACE) 100 MG capsule Take 100 mg by mouth daily as needed for mild constipation.    . gabapentin (NEURONTIN) 100 MG capsule TAKE 1 CAPSULE  (100 MG TOTAL) BY MOUTH AT BEDTIME. 30 capsule 5  . sertraline (ZOLOFT) 25 MG tablet Take 1 tablet (25 mg total) by mouth daily. 90 tablet 2  . atenolol (TENORMIN) 50 MG tablet TAKE 1 TABLET (50 MG TOTAL) BY MOUTH DAILY. 90 tablet 2  . lisinopril (PRINIVIL,ZESTRIL) 10 MG tablet TAKE 1 TABLET BY MOUTH ONCE DAILY 90 tablet 2   No facility-administered medications prior to visit.     No Known Allergies  Review of Systems  Constitutional: Negative for fever and malaise/fatigue.  HENT: Negative for congestion.   Eyes: Negative for blurred vision.  Respiratory: Negative for shortness of breath.   Cardiovascular: Negative for chest pain, palpitations and leg swelling.  Gastrointestinal: Negative for abdominal pain, blood in stool and nausea.  Genitourinary: Negative for dysuria and frequency.  Musculoskeletal: Negative for falls.  Skin: Negative for rash.  Neurological: Negative for dizziness, loss of consciousness and headaches.  Endo/Heme/Allergies: Negative for environmental allergies.  Psychiatric/Behavioral: Negative for depression. The patient is nervous/anxious.        Objective:    Physical Exam  Constitutional: She is oriented to person, place, and time. She appears well-developed and well-nourished. No distress.  HENT:  Head: Normocephalic and atraumatic.  Nose: Nose normal.  Eyes: Right eye exhibits no discharge. Left eye exhibits no discharge.  Neck: Normal range of motion. Neck supple.  Cardiovascular: Normal rate and regular rhythm.   No murmur heard. Pulmonary/Chest: Effort normal and breath sounds normal.  Abdominal: Soft. Bowel sounds are normal. There is no tenderness.  Musculoskeletal: She exhibits no edema.  Neurological: She is alert and oriented to person, place, and time.  Skin: Skin is warm and dry.  Psychiatric: She has a normal mood and affect.  Nursing note and vitals reviewed.   BP 104/70   Pulse 64   Temp 97.9 F (36.6 C) (Oral)   Ht 5' (1.524  m)   Wt 93 lb 8 oz (42.4 kg)   SpO2 95%   BMI 18.26 kg/m  Wt Readings from Last 3 Encounters:  01/07/16 93 lb 8 oz (42.4 kg)  07/10/15 98 lb 4 oz (44.6 kg)  04/12/15 93 lb 12.8 oz (42.5 kg)     Lab Results  Component Value Date   WBC 3.8 (L) 04/05/2015   HGB 14.4 04/05/2015   HCT 43.7 04/05/2015   PLT 155.0 04/05/2015   GLUCOSE 89 04/05/2015   CHOL 213 (H) 04/05/2015   TRIG 121.0 04/05/2015   HDL 65.30 04/05/2015   LDLCALC 123 (H) 04/05/2015   ALT 18 04/05/2015   AST 25 04/05/2015   NA 142 04/05/2015   K 4.2 04/05/2015   CL 103 04/05/2015   CREATININE 0.81 04/05/2015   BUN 15 04/05/2015   CO2 32 04/05/2015   TSH 1.64 04/05/2015    Lab Results  Component Value Date   TSH 1.64 04/05/2015   Lab Results  Component Value Date   WBC 3.8 (L) 04/05/2015   HGB 14.4 04/05/2015   HCT 43.7 04/05/2015   MCV 90.8 04/05/2015   PLT 155.0 04/05/2015   Lab Results  Component Value Date   NA 142 04/05/2015   K 4.2 04/05/2015   CO2 32 04/05/2015   GLUCOSE 89 04/05/2015   BUN 15 04/05/2015   CREATININE 0.81 04/05/2015   BILITOT 0.6 04/05/2015   ALKPHOS 78 04/05/2015   AST 25 04/05/2015   ALT 18 04/05/2015   PROT 7.2 04/05/2015   ALBUMIN 4.3 04/05/2015   CALCIUM 9.7 04/05/2015   GFR 71.18 04/05/2015   Lab Results  Component Value Date   CHOL 213 (H) 04/05/2015   Lab Results  Component Value Date   HDL 65.30 04/05/2015   Lab Results  Component Value Date   LDLCALC 123 (H) 04/05/2015   Lab Results  Component Value Date   TRIG 121.0 04/05/2015   Lab Results  Component Value Date   CHOLHDL 3 04/05/2015   No results found for: HGBA1C     Assessment & Plan:   Problem List Items Addressed This Visit    Anxiety    Her son died a month ago so she is struggling but she feels she is doing well enough most days. No change in meds offered counseling but she declines for now. She will continue Sertraline 25 mg daily      Hyperlipidemia    Encouraged heart  healthy diet, increase exercise, avoid trans fats, consider a krill oil cap daily      Relevant Medications   lisinopril (PRINIVIL,ZESTRIL) 10 MG tablet   atenolol (TENORMIN) 50 MG tablet   Hypertension    Well controlled, no changes to meds. Encouraged heart healthy diet such as the  DASH diet and exercise as tolerated.       Relevant Medications   lisinopril (PRINIVIL,ZESTRIL) 10 MG tablet   atenolol (TENORMIN) 50 MG tablet    Other Visit Diagnoses   None.     I have changed Ms. Kowalke's lisinopril. I am also having her maintain her Calcium Citrate-Vitamin D (CALCIUM CITRATE + PO), docusate sodium, sertraline, ALPRAZolam, gabapentin, and atenolol.  Meds ordered this encounter  Medications  . lisinopril (PRINIVIL,ZESTRIL) 10 MG tablet    Sig: Take 1 tablet (10 mg total) by mouth daily.    Dispense:  90 tablet    Refill:  2  . atenolol (TENORMIN) 50 MG tablet    Sig: TAKE 1 TABLET (50 MG TOTAL) BY MOUTH DAILY.    Dispense:  90 tablet    Refill:  2     Penni Homans, MD

## 2016-01-20 NOTE — Assessment & Plan Note (Signed)
Encouraged heart healthy diet, increase exercise, avoid trans fats, consider a krill oil cap daily 

## 2016-01-20 NOTE — Assessment & Plan Note (Signed)
Well controlled, no changes to meds. Encouraged heart healthy diet such as the DASH diet and exercise as tolerated.  °

## 2016-01-20 NOTE — Assessment & Plan Note (Signed)
Her son died a month ago so she is struggling but she feels she is doing well enough most days. No change in meds offered counseling but she declines for now. She will continue Sertraline 25 mg daily

## 2016-04-03 ENCOUNTER — Other Ambulatory Visit: Payer: Self-pay | Admitting: Family Medicine

## 2016-05-02 DIAGNOSIS — Z23 Encounter for immunization: Secondary | ICD-10-CM | POA: Diagnosis not present

## 2016-06-09 ENCOUNTER — Encounter: Payer: Self-pay | Admitting: Family Medicine

## 2016-06-09 ENCOUNTER — Ambulatory Visit (INDEPENDENT_AMBULATORY_CARE_PROVIDER_SITE_OTHER): Payer: Medicare Other | Admitting: Family Medicine

## 2016-06-09 VITALS — BP 106/68 | HR 68 | Temp 97.6°F | Wt 95.6 lb

## 2016-06-09 DIAGNOSIS — Z0000 Encounter for general adult medical examination without abnormal findings: Secondary | ICD-10-CM | POA: Diagnosis not present

## 2016-06-09 DIAGNOSIS — I10 Essential (primary) hypertension: Secondary | ICD-10-CM

## 2016-06-09 DIAGNOSIS — E782 Mixed hyperlipidemia: Secondary | ICD-10-CM

## 2016-06-09 HISTORY — DX: Encounter for general adult medical examination without abnormal findings: Z00.00

## 2016-06-09 NOTE — Assessment & Plan Note (Signed)
Encouraged heart healthy diet, increase exercise, avoid trans fats, consider a krill oil cap daily 

## 2016-06-09 NOTE — Progress Notes (Signed)
Pre visit review using our clinic review tool, if applicable. No additional management support is needed unless otherwise documented below in the visit note. 

## 2016-06-09 NOTE — Assessment & Plan Note (Addendum)
Patient encouraged to maintain heart healthy diet, regular exercise, adequate sleep. Consider daily probiotics. Take medications as prescribed. Given and reviewed copy of ACP documents from [place] Secretary of State and encouraged to complete and return 

## 2016-06-09 NOTE — Patient Instructions (Signed)
Citracal Petite twice daily and Vitamin D 2000 IU daily  Preventive Care 28 Years and Older, Female Preventive care refers to lifestyle choices and visits with your health care provider that can promote health and wellness. What does preventive care include?  A yearly physical exam. This is also called an annual well check.  Dental exams once or twice a year.  Routine eye exams. Ask your health care provider how often you should have your eyes checked.  Personal lifestyle choices, including:  Daily care of your teeth and gums.  Regular physical activity.  Eating a healthy diet.  Avoiding tobacco and drug use.  Limiting alcohol use.  Practicing safe sex.  Taking low-dose aspirin every day.  Taking vitamin and mineral supplements as recommended by your health care provider. What happens during an annual well check? The services and screenings done by your health care provider during your annual well check will depend on your age, overall health, lifestyle risk factors, and family history of disease. Counseling  Your health care provider may ask you questions about your:  Alcohol use.  Tobacco use.  Drug use.  Emotional well-being.  Home and relationship well-being.  Sexual activity.  Eating habits.  History of falls.  Memory and ability to understand (cognition).  Work and work Statistician.  Reproductive health. Screening  You may have the following tests or measurements:  Height, weight, and BMI.  Blood pressure.  Lipid and cholesterol levels. These may be checked every 5 years, or more frequently if you are over 8 years old.  Skin check.  Lung cancer screening. You may have this screening every year starting at age 80 if you have a 30-pack-year history of smoking and currently smoke or have quit within the past 15 years.  Fecal occult blood test (FOBT) of the stool. You may have this test every year starting at age 49.  Flexible sigmoidoscopy or  colonoscopy. You may have a sigmoidoscopy every 5 years or a colonoscopy every 10 years starting at age 52.  Hepatitis C blood test.  Hepatitis B blood test.  Sexually transmitted disease (STD) testing.  Diabetes screening. This is done by checking your blood sugar (glucose) after you have not eaten for a while (fasting). You may have this done every 1-3 years.  Bone density scan. This is done to screen for osteoporosis. You may have this done starting at age 51.  Mammogram. This may be done every 1-2 years. Talk to your health care provider about how often you should have regular mammograms. Talk with your health care provider about your test results, treatment options, and if necessary, the need for more tests. Vaccines  Your health care provider may recommend certain vaccines, such as:  Influenza vaccine. This is recommended every year.  Tetanus, diphtheria, and acellular pertussis (Tdap, Td) vaccine. You may need a Td booster every 10 years.  Varicella vaccine. You may need this if you have not been vaccinated.  Zoster vaccine. You may need this after age 11.  Measles, mumps, and rubella (MMR) vaccine. You may need at least one dose of MMR if you were born in 1957 or later. You may also need a second dose.  Pneumococcal 13-valent conjugate (PCV13) vaccine. One dose is recommended after age 43.  Pneumococcal polysaccharide (PPSV23) vaccine. One dose is recommended after age 98.  Meningococcal vaccine. You may need this if you have certain conditions.  Hepatitis A vaccine. You may need this if you have certain conditions or if you  travel or work in places where you may be exposed to hepatitis A.  Hepatitis B vaccine. You may need this if you have certain conditions or if you travel or work in places where you may be exposed to hepatitis B.  Haemophilus influenzae type b (Hib) vaccine. You may need this if you have certain conditions. Talk to your health care provider about  which screenings and vaccines you need and how often you need them. This information is not intended to replace advice given to you by your health care provider. Make sure you discuss any questions you have with your health care provider. Document Released: 07/13/2015 Document Revised: 03/05/2016 Document Reviewed: 04/17/2015 Elsevier Interactive Patient Education  2017 [employer].

## 2016-06-09 NOTE — Assessment & Plan Note (Signed)
Well controlled, no changes to meds. Encouraged heart healthy diet such as the DASH diet and exercise as tolerated.  °

## 2016-06-10 LAB — LIPID PANEL
CHOL/HDL RATIO: 3
Cholesterol: 203 mg/dL — ABNORMAL HIGH (ref 0–200)
HDL: 70.6 mg/dL (ref >39.00)
LDL Cholesterol: 111 mg/dL — ABNORMAL HIGH (ref 0–99)
NONHDL: 132.09
Triglycerides: 103 mg/dL (ref 0.0–149.0)
VLDL: 20.6 mg/dL (ref 0.0–40.0)

## 2016-06-10 LAB — TSH: TSH: 2.4 u[IU]/mL (ref 0.35–4.50)

## 2016-06-10 LAB — CBC
HCT: 40.9 % (ref 36.0–46.0)
HEMOGLOBIN: 13.8 g/dL (ref 12.0–15.0)
MCHC: 33.8 g/dL (ref 30.0–36.0)
MCV: 89.8 fl (ref 78.0–100.0)
PLATELETS: 160 10*3/uL (ref 150.0–400.0)
RBC: 4.56 Mil/uL (ref 3.87–5.11)
RDW: 14.4 % (ref 11.5–15.5)
WBC: 3.7 10*3/uL — AB (ref 4.0–10.5)

## 2016-06-10 LAB — COMPREHENSIVE METABOLIC PANEL
ALK PHOS: 69 U/L (ref 39–117)
ALT: 16 U/L (ref 0–35)
AST: 22 U/L (ref 0–37)
Albumin: 4.1 g/dL (ref 3.5–5.2)
BILIRUBIN TOTAL: 0.5 mg/dL (ref 0.2–1.2)
BUN: 15 mg/dL (ref 6–23)
CO2: 31 meq/L (ref 19–32)
CREATININE: 0.75 mg/dL (ref 0.40–1.20)
Calcium: 9.8 mg/dL (ref 8.4–10.5)
Chloride: 103 mEq/L (ref 96–112)
GFR: 77.57 mL/min (ref >60.00)
GLUCOSE: 86 mg/dL (ref 70–99)
Potassium: 4.3 mEq/L (ref 3.5–5.1)
Sodium: 141 mEq/L (ref 135–145)
TOTAL PROTEIN: 6.9 g/dL (ref 6.0–8.3)

## 2016-06-18 ENCOUNTER — Other Ambulatory Visit: Payer: Self-pay | Admitting: Family Medicine

## 2016-06-18 NOTE — Telephone Encounter (Signed)
Last refill of Alprazolam was on 12/10/2015   #30 with 5 refills. Last office visit 06/09/2016

## 2016-06-18 NOTE — Telephone Encounter (Signed)
Relation to pt: self °Call back number: [phone] ° ° °Reason for call:  °Patient requesting a refill ALPRAZolam (XANAX) 1 MG tablet  ° °

## 2016-06-19 MED ORDER — ALPRAZOLAM 1 MG PO TABS: 0.5000 mg | ORAL_TABLET | Freq: Two times a day (BID) | ORAL | 5 refills | Status: DC | PRN

## 2016-06-19 NOTE — Telephone Encounter (Signed)
Will fax hardcopy for [facility]prazolam to [facility] [address] [employer] [place]

## 2016-06-20 ENCOUNTER — Telehealth: Payer: Self-pay | Admitting: Family Medicine

## 2016-06-20 NOTE — Telephone Encounter (Signed)
Noted  

## 2016-06-20 NOTE — Telephone Encounter (Signed)
°  Relation to [id number] Call back [place]   Reason for call:  Patient changed her mind about ordering a back brace

## 2016-07-01 NOTE — Progress Notes (Signed)
Patient ID: Peggy Peterson, female   DOB: 1927/04/24, 83 y.o.   MRN: [id number]   Subjective:    Patient ID: Peggy Peterson, female    DOB: [birth year], 83 y.o.   MRN: [id number]  Chief Complaint  Patient presents with  . Annual Exam    HPI Patient is in today for annual medicare wellness exam and follow up on her chronic medical concerns. 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 She notes some fatigue and stress at times but feels well today and reports overall she is doing well. She denies any acute illness or acute concerns. Is doing well with ADLs and is eating well. Denies CP/palp/SOB/HA/congestion/fevers/GI or GU c/o. Taking meds as prescribed  Past Medical History:  Diagnosis Date  . Anxiety   . H/O measles   . H/O mumps   . History of chicken pox   . Hyperlipidemia   . Hypertension   . Leukopenia 04/08/2015  . Paroxysmal atrial fibrillation ([place]) 04/05/2015  . Preventative health care 06/09/2016    Past Surgical History:  Procedure Laterality Date  . ABDOMINAL HYSTERECTOMY      Family History  Problem Relation Age of Onset  . Cancer Mother 39    leukemia   . Diabetes Father   . Cancer Father   . Arthritis Sister   . Dementia Brother   . Hip fracture Brother   . Diabetes Paternal Uncle     Social History   Social History  . Marital status: Married    Spouse name: N/A  . Number of children: N/A  . Years of education: N/A   Occupational History  . retired     Social History Main Topics  . Smoking status: Former Research scientist (life sciences)  . Smokeless tobacco: Not on file     Comment: Stopper smoking in 1960  . Alcohol use No  . Drug use: No  . Sexual activity: No     Comment: lives with husband, no dietary restrictions.   Other Topics Concern  . Not on file   Social History Narrative   Married cares for husband at home. No dietary restrictions    Outpatient Medications Prior to Visit  Medication Sig Dispense Refill  . atenolol (TENORMIN) 50 MG  tablet TAKE 1 TABLET (50 MG TOTAL) BY MOUTH DAILY. 90 tablet 2  . Calcium Citrate-Vitamin D (CALCIUM CITRATE + PO) Take 2 tablets by mouth daily.    Marland Kitchen docusate sodium (COLACE) 100 MG capsule Take 100 mg by mouth daily as needed for mild constipation.    . gabapentin (NEURONTIN) 100 MG capsule TAKE 1 CAPSULE (100 MG TOTAL) BY MOUTH AT BEDTIME. 30 capsule 5  . lisinopril (PRINIVIL,ZESTRIL) 10 MG tablet Take 1 tablet (10 mg total) by mouth daily. 90 tablet 2  . sertraline (ZOLOFT) 25 MG tablet TAKE 1 TABLET (25 MG TOTAL) BY MOUTH DAILY. 90 tablet 2  . ALPRAZolam (XANAX) 1 MG tablet Take 0.5-1 tablets (0.5-1 mg total) by mouth 2 (two) times daily as needed for anxiety. 30 tablet 5   No facility-administered medications prior to visit.     No Known Allergies  Review of Systems  Constitutional: Negative for chills, fever and malaise/fatigue.  HENT: Negative for congestion and hearing loss.   Eyes: Negative for discharge.  Respiratory: Negative for cough, sputum production and shortness of breath.   Cardiovascular: Negative for chest pain, palpitations and leg swelling.  Gastrointestinal: Negative for abdominal pain, blood in stool, constipation, diarrhea,  heartburn, nausea and vomiting.  Genitourinary: Negative for dysuria, frequency, hematuria and urgency.  Musculoskeletal: Negative for back pain, falls and myalgias.  Skin: Negative for rash.  Neurological: Negative for dizziness, sensory change, loss of consciousness, weakness and headaches.  Endo/Heme/Allergies: Negative for environmental allergies. Does not bruise/bleed easily.  Psychiatric/Behavioral: Negative for depression and suicidal ideas. The patient is not nervous/anxious and does not have insomnia.        Objective:    Physical Exam  Constitutional: She is oriented to person, place, and time. She appears well-developed and well-nourished. No distress.  HENT:  Head: Normocephalic and atraumatic.  Eyes: Conjunctivae are  normal.  Neck: Neck supple. No thyromegaly present.  Cardiovascular: Normal rate, regular rhythm and normal heart sounds.   No murmur heard. Pulmonary/Chest: Effort normal and breath sounds normal. No respiratory distress.  Abdominal: Soft. Bowel sounds are normal. She exhibits no distension and no mass. There is no tenderness.  Musculoskeletal: She exhibits no edema.  Lymphadenopathy:    She has no cervical adenopathy.  Neurological: She is alert and oriented to person, place, and time.  Skin: Skin is warm and dry.  Psychiatric: She has a normal mood and affect. Her behavior is normal.    BP 106/68 (BP Location: Left Arm, Patient Position: Sitting, Cuff Size: Normal)   Pulse 68   Temp 97.6 F (36.4 C) (Oral)   Wt 95 lb 9.6 oz (43.4 kg)   SpO2 93% Comment: RA  BMI 18.67 kg/m  Wt Readings from Last 3 Encounters:  06/09/16 95 lb 9.6 oz (43.4 kg)  01/07/16 93 lb 8 oz (42.4 kg)  07/10/15 98 lb 4 oz (44.6 kg)     Lab Results  Component Value Date   WBC 3.7 (L) 06/09/2016   HGB 13.8 06/09/2016   HCT 40.9 06/09/2016   PLT 160.0 06/09/2016   GLUCOSE 86 06/09/2016   CHOL 203 (H) 06/09/2016   TRIG 103.0 06/09/2016   HDL 70.60 06/09/2016   LDLCALC 111 (H) 06/09/2016   ALT 16 06/09/2016   AST 22 06/09/2016   NA 141 06/09/2016   K 4.3 06/09/2016   CL 103 06/09/2016   CREATININE 0.75 06/09/2016   BUN 15 06/09/2016   CO2 31 06/09/2016   TSH 2.40 06/09/2016    Lab Results  Component Value Date   TSH 2.40 06/09/2016   Lab Results  Component Value Date   WBC 3.7 (L) 06/09/2016   HGB 13.8 06/09/2016   HCT 40.9 06/09/2016   MCV 89.8 06/09/2016   PLT 160.0 06/09/2016   Lab Results  Component Value Date   NA 141 06/09/2016   K 4.3 06/09/2016   CO2 31 06/09/2016   GLUCOSE 86 06/09/2016   BUN 15 06/09/2016   CREATININE 0.75 06/09/2016   BILITOT 0.5 06/09/2016   ALKPHOS 69 06/09/2016   AST 22 06/09/2016   ALT 16 06/09/2016   PROT 6.9 06/09/2016   ALBUMIN 4.1  06/09/2016   CALCIUM 9.8 06/09/2016   GFR 77.57 06/09/2016   Lab Results  Component Value Date   CHOL 203 (H) 06/09/2016   Lab Results  Component Value Date   HDL 70.60 06/09/2016   Lab Results  Component Value Date   LDLCALC 111 (H) 06/09/2016   Lab Results  Component Value Date   TRIG 103.0 06/09/2016   Lab Results  Component Value Date   CHOLHDL 3 06/09/2016   No results found for: HGBA1C     Assessment & Plan:   Problem  List Items Addressed This Visit    Hyperlipidemia    Encouraged heart healthy diet, increase exercise, avoid trans fats, consider a krill oil cap daily      Relevant Orders   Lipid panel (Completed)   Hypertension    Well controlled, no changes to meds. Encouraged heart healthy diet such as the DASH diet and exercise as tolerated.       Relevant Orders   CBC (Completed)   Comprehensive metabolic panel (Completed)   TSH (Completed)   Medicare annual wellness visit, subsequent    Patient encouraged to maintain heart healthy diet, regular exercise, adequate sleep. Consider daily probiotics. Take medications as prescribed. Given and reviewed copy of ACP documents from [facility] Secretary of State and encouraged to complete and return         I am having Ms. Stiff maintain her Calcium Citrate-Vitamin D (CALCIUM CITRATE + PO), docusate sodium, gabapentin, lisinopril, atenolol, and sertraline.  No orders of the defined types were placed in this encounter.    Penni Homans, MD

## 2016-07-02 ENCOUNTER — Telehealth: Payer: Self-pay | Admitting: Family Medicine

## 2016-07-02 NOTE — Telephone Encounter (Signed)
Would be helpful if you put the strength of the Atenolol in the note. Can change to metoprolol XL 50 mg tabs, q tab po daily disp #30 with 5 rf. Have her in in 2-3 weeks for rn bp check

## 2016-07-02 NOTE — Telephone Encounter (Signed)
Fax from pharmacy stating Atenolol is no longer being produced.  Need replacement for this--thanks

## 2016-07-03 MED ORDER — METOPROLOL SUCCINATE ER 50 MG PO TB24: 50.0000 mg | ORAL_TABLET | Freq: Every day | ORAL | 5 refills | Status: DC

## 2016-07-03 NOTE — Telephone Encounter (Signed)
Updated medication list and sent in metoprolol. Patient notified of change and did schedule nurse visit appt. On 07/22/2016 to check BP.

## 2016-07-10 ENCOUNTER — Telehealth: Payer: Self-pay | Admitting: Family Medicine

## 2016-07-10 NOTE — Telephone Encounter (Signed)
error:[id number] ° °

## 2016-07-22 ENCOUNTER — Encounter: Payer: Medicare Other | Admitting: Behavioral Health

## 2016-07-22 NOTE — Progress Notes (Deleted)
Pre visit review using our clinic review tool, if applicable. No additional management support is needed unless otherwise documented below in the visit note. 

## 2016-08-11 ENCOUNTER — Other Ambulatory Visit: Payer: Self-pay | Admitting: Family Medicine

## 2016-11-11 ENCOUNTER — Other Ambulatory Visit: Payer: Self-pay | Admitting: Family Medicine

## 2016-11-11 NOTE — Telephone Encounter (Signed)
Last appt 06/09/2016 Refill 12/10/2015  #30 with 5 refills.

## 2016-12-24 ENCOUNTER — Other Ambulatory Visit: Payer: Self-pay | Admitting: Family Medicine

## 2016-12-24 NOTE — Telephone Encounter (Signed)
Patient is requesting refill for ALPRAZolam Duanne Moron) 1 MG tablet she has 5 remaining

## 2016-12-25 MED ORDER — ALPRAZOLAM 1 MG PO TABS: 0.5000 mg | ORAL_TABLET | Freq: Two times a day (BID) | ORAL | 0 refills | Status: DC | PRN

## 2016-12-25 NOTE — Telephone Encounter (Signed)
Patient made aware of UDS and contract she has scheduled appt for 02/02/17.    PC

## 2016-12-25 NOTE — Telephone Encounter (Signed)
She can have #30 without further refills but she has to do UDS and contract then she needs an appt before she gets more since it has been so long since seen

## 2016-12-25 NOTE — Telephone Encounter (Signed)
Requesting:Xanax Contract:No UDS:No Last OV:12/11/ 18 Last Refill: 06/19/16  #[id number] Next EO:FHQR  Please Advise

## 2016-12-26 ENCOUNTER — Encounter: Payer: Self-pay | Admitting: Family Medicine

## 2016-12-26 DIAGNOSIS — Z79899 Other long term (current) drug therapy: Secondary | ICD-10-CM | POA: Diagnosis not present

## 2017-01-03 ENCOUNTER — Other Ambulatory Visit: Payer: Self-pay | Admitting: Family Medicine

## 2017-01-04 ENCOUNTER — Other Ambulatory Visit: Payer: Self-pay | Admitting: Family Medicine

## 2017-01-14 ENCOUNTER — Telehealth: Payer: Self-pay | Admitting: Family Medicine

## 2017-01-14 NOTE — Telephone Encounter (Signed)
Relation to pt: self Call back number: [phone]   Reason for call:  Patient requesting a refill ALPRAZolam (XANAX) 1 MG tablet

## 2017-01-15 MED ORDER — ALPRAZOLAM 1 MG PO TABS: 0.5000 mg | ORAL_TABLET | Freq: Two times a day (BID) | ORAL | 1 refills | Status: DC | PRN

## 2017-01-15 NOTE — Telephone Encounter (Signed)
She can have #30 with 1 rf but remind her that 30 is a month supply

## 2017-01-15 NOTE — Telephone Encounter (Signed)
Faxed to [facility] [place] Patient informed of PCP instructions.

## 2017-01-15 NOTE — Telephone Encounter (Signed)
Requesting:   alprazolam Contract     12/26/2016 UDS     given Last OV    06/09/2016-----future appt is on 02/02/17 Last Refill    #30  On 12/25/2016  Please Advise

## 2017-02-02 ENCOUNTER — Encounter: Payer: Self-pay | Admitting: Family Medicine

## 2017-02-02 ENCOUNTER — Ambulatory Visit (HOSPITAL_BASED_OUTPATIENT_CLINIC_OR_DEPARTMENT_OTHER)
Admission: RE | Admit: 2017-02-02 | Discharge: 2017-02-02 | Disposition: A | Discharge status: Home / Self Care | Payer: Medicare Other | Source: Ambulatory Visit | Attending: Family Medicine | Admitting: Family Medicine

## 2017-02-02 ENCOUNTER — Ambulatory Visit (INDEPENDENT_AMBULATORY_CARE_PROVIDER_SITE_OTHER): Payer: Medicare Other | Admitting: Family Medicine

## 2017-02-02 VITALS — BP 118/66 | HR 60 | Temp 97.8°F | Resp 18 | Wt 95.6 lb

## 2017-02-02 DIAGNOSIS — M8580 Other specified disorders of bone density and structure, unspecified site: Secondary | ICD-10-CM | POA: Insufficient documentation

## 2017-02-02 DIAGNOSIS — Z780 Asymptomatic menopausal state: Secondary | ICD-10-CM | POA: Insufficient documentation

## 2017-02-02 DIAGNOSIS — I10 Essential (primary) hypertension: Secondary | ICD-10-CM | POA: Diagnosis not present

## 2017-02-02 DIAGNOSIS — E2839 Other primary ovarian failure: Secondary | ICD-10-CM

## 2017-02-02 DIAGNOSIS — Z13820 Encounter for screening for osteoporosis: Secondary | ICD-10-CM | POA: Insufficient documentation

## 2017-02-02 DIAGNOSIS — F419 Anxiety disorder, unspecified: Secondary | ICD-10-CM | POA: Diagnosis not present

## 2017-02-02 DIAGNOSIS — M810 Age-related osteoporosis without current pathological fracture: Secondary | ICD-10-CM | POA: Diagnosis not present

## 2017-02-02 DIAGNOSIS — E782 Mixed hyperlipidemia: Secondary | ICD-10-CM | POA: Diagnosis not present

## 2017-02-02 DIAGNOSIS — Z23 Encounter for immunization: Secondary | ICD-10-CM

## 2017-02-02 MED ORDER — SERTRALINE HCL 50 MG PO TABS: 50.0000 mg | ORAL_TABLET | Freq: Every day | ORAL | 3 refills | Status: DC

## 2017-02-02 NOTE — Assessment & Plan Note (Signed)
Encouraged heart healthy diet, increase exercise, avoid trans fats, consider a krill oil cap daily 

## 2017-02-02 NOTE — Progress Notes (Signed)
Subjective:  I acted as a Education administrator for Dr. Charlett Blake. [place], [place]  Patient ID: Peggy Peterson, female    DOB: 12-28-1926, 83 y.o.   MRN: [id number]  No chief complaint on file.   HPI  Patient is in today for a follow up. Patient has no acute concerns. Patient is following up on HTN, hyperlipidemia and other medical concerns. No recent febrile illness or acute hospitalizations. Denies CP/palp/SOB/HA/congestion/fevers/GI or GU c/o. Taking meds as prescribed. IN today accompanied by her daughter's and they confirm she does well caring for her husband but anxiety with hot flashes occurs daily. No palpitations    Patient Care Team: Mosie Lukes, MD as PCP - General (Family Medicine)   Past Medical History:  Diagnosis Date  . Anxiety   . H/O measles   . H/O mumps   . History of chicken pox   . Hyperlipidemia   . Hypertension   . Leukopenia 04/08/2015  . Paroxysmal atrial fibrillation ([place]) 04/05/2015  . Preventative health care 06/09/2016    Past Surgical History:  Procedure Laterality Date  . ABDOMINAL HYSTERECTOMY      Family History  Problem Relation Age of Onset  . Cancer Mother 29       leukemia   . Diabetes Father   . Cancer Father   . Arthritis Sister   . Dementia Brother   . Hip fracture Brother   . Diabetes Paternal Uncle     Social History   Social History  . Marital status: Married    Spouse name: N/A  . Number of children: N/A  . Years of education: N/A   Occupational History  . retired     Social History Main Topics  . Smoking status: Former Research scientist (life sciences)  . Smokeless tobacco: Not on file     Comment: Stopper smoking in 1960  . Alcohol use No  . Drug use: No  . Sexual activity: No     Comment: lives with husband, no dietary restrictions.   Other Topics Concern  . Not on file   Social History Narrative   Married cares for husband at home. No dietary restrictions    Outpatient Medications Prior to Visit  Medication Sig Dispense Refill  . ALPRAZolam  (XANAX) 1 MG tablet Take 0.5-1 tablets (0.5-1 mg total) by mouth 2 (two) times daily as needed for anxiety. 30 tablet 1  . Calcium Citrate-Vitamin D (CALCIUM CITRATE + PO) Take 2 tablets by mouth daily.    Marland Kitchen docusate sodium (COLACE) 100 MG capsule Take 100 mg by mouth daily as needed for mild constipation.    . gabapentin (NEURONTIN) 100 MG capsule TAKE 1 CAPSULE (100 MG TOTAL) BY MOUTH AT BEDTIME. 30 capsule 5  . lisinopril (PRINIVIL,ZESTRIL) 10 MG tablet TAKE 1 TABLET BY MOUTH ONCE DAILY 90 tablet 2  . metoprolol succinate (TOPROL-XL) 50 MG 24 hr tablet TAKE 1 TABLET (50 MG TOTAL) BY MOUTH DAILY. TAKE WITH OR IMMEDIATELY FOLLOWING A MEAL. 30 tablet 5  . sertraline (ZOLOFT) 25 MG tablet TAKE 1 TABLET (25 MG TOTAL) BY MOUTH DAILY. 90 tablet 2  . lisinopril (PRINIVIL,ZESTRIL) 10 MG tablet Take 1 tablet (10 mg total) by mouth daily. 90 tablet 2   No facility-administered medications prior to visit.     No Known Allergies  Review of Systems  Constitutional: Negative for fever and malaise/fatigue.  HENT: Negative for congestion.   Eyes: Negative for blurred vision.  Respiratory: Negative for cough and shortness of breath.  Cardiovascular: Negative for chest pain, palpitations and leg swelling.  Gastrointestinal: Negative for vomiting.  Musculoskeletal: Negative for back pain.  Skin: Negative for rash.  Neurological: Negative for loss of consciousness and headaches.  Psychiatric/Behavioral: Negative for depression. The patient is nervous/anxious.        Objective:    Physical Exam  Constitutional: She is oriented to person, place, and time. She appears well-developed and well-nourished. No distress.  HENT:  Head: Normocephalic and atraumatic.  Eyes: Conjunctivae are normal.  Neck: Normal range of motion. No thyromegaly present.  Cardiovascular: Normal rate and regular rhythm.   Pulmonary/Chest: Effort normal and breath sounds normal. She has no wheezes. She exhibits no tenderness.    Abdominal: Soft. Bowel sounds are normal. There is no tenderness.  Musculoskeletal: Normal range of motion. She exhibits no edema or deformity.  Neurological: She is alert and oriented to person, place, and time.  Skin: Skin is warm and dry. She is not diaphoretic.  Psychiatric: She has a normal mood and affect.    BP 118/66 (BP Location: Left Arm, Patient Position: Sitting, Cuff Size: Normal)   Pulse 60   Temp 97.8 F (36.6 C) (Oral)   Resp 18   Wt 95 lb 9.6 oz (43.4 kg)   SpO2 97%   BMI 18.67 kg/m  Wt Readings from Last 3 Encounters:  02/02/17 95 lb 9.6 oz (43.4 kg)  06/09/16 95 lb 9.6 oz (43.4 kg)  01/07/16 93 lb 8 oz (42.4 kg)   BP Readings from Last 3 Encounters:  02/02/17 118/66  06/09/16 106/68  01/07/16 104/70     Immunization History  Administered Date(s) Administered  . Influenza, High Dose Seasonal PF 04/05/2015  . Influenza-Unspecified 05/02/2016  . Pneumococcal Conjugate-13 07/10/2015    Health Maintenance  Topic Date Due  . TETANUS/TDAP  10/15/1947  . DEXA SCAN  10/14/1993  . PNA vac Low Risk Adult (2 of 2 - PPSV23) 07/09/2016  . INFLUENZA VACCINE  01/28/2017    Lab Results  Component Value Date   WBC 3.7 (L) 06/09/2016   HGB 13.8 06/09/2016   HCT 40.9 06/09/2016   PLT 160.0 06/09/2016   GLUCOSE 86 06/09/2016   CHOL 203 (H) 06/09/2016   TRIG 103.0 06/09/2016   HDL 70.60 06/09/2016   LDLCALC 111 (H) 06/09/2016   ALT 16 06/09/2016   AST 22 06/09/2016   NA 141 06/09/2016   K 4.3 06/09/2016   CL 103 06/09/2016   CREATININE 0.75 06/09/2016   BUN 15 06/09/2016   CO2 31 06/09/2016   TSH 2.40 06/09/2016    Lab Results  Component Value Date   TSH 2.40 06/09/2016   Lab Results  Component Value Date   WBC 3.7 (L) 06/09/2016   HGB 13.8 06/09/2016   HCT 40.9 06/09/2016   MCV 89.8 06/09/2016   PLT 160.0 06/09/2016   Lab Results  Component Value Date   NA 141 06/09/2016   K 4.3 06/09/2016   CO2 31 06/09/2016   GLUCOSE 86 06/09/2016    BUN 15 06/09/2016   CREATININE 0.75 06/09/2016   BILITOT 0.5 06/09/2016   ALKPHOS 69 06/09/2016   AST 22 06/09/2016   ALT 16 06/09/2016   PROT 6.9 06/09/2016   ALBUMIN 4.1 06/09/2016   CALCIUM 9.8 06/09/2016   GFR 77.57 06/09/2016   Lab Results  Component Value Date   CHOL 203 (H) 06/09/2016   Lab Results  Component Value Date   HDL 70.60 06/09/2016   Lab Results  Component  Value Date   LDLCALC 111 (H) 06/09/2016   Lab Results  Component Value Date   TRIG 103.0 06/09/2016   Lab Results  Component Value Date   CHOLHDL 3 06/09/2016   No results found for: HGBA1C       Assessment & Plan:   Problem List Items Addressed This Visit    Anxiety    Doing well on current meds but daughters confirm she gets anxious with hot flashes daily. Uses Alprazolam bid, will increase Sertraline to 50 mg po daily.      Relevant Medications   sertraline (ZOLOFT) 50 MG tablet   Hyperlipidemia    Encouraged heart healthy diet, increase exercise, avoid trans fats, consider a krill oil cap daily      Hypertension    Well controlled, no changes to meds. Encouraged heart healthy diet such as the DASH diet and exercise as tolerated.        Other Visit Diagnoses    Osteopenia, unspecified location    -  Primary   Relevant Orders   DG Bone Density   Estrogen deficiency       Relevant Orders   DG Bone Density      I have discontinued Ms. Flavell's sertraline. I am also having her start on sertraline. Additionally, I am having her maintain her Calcium Citrate-Vitamin D (CALCIUM CITRATE + PO), docusate sodium, gabapentin, lisinopril, metoprolol succinate, and ALPRAZolam.  Meds ordered this encounter  Medications  . sertraline (ZOLOFT) 50 MG tablet    Sig: Take 1 tablet (50 mg total) by mouth daily.    Dispense:  30 tablet    Refill:  3    CMA served as scribe during this visit. History, Physical and Plan performed by medical provider. Documentation and orders reviewed and attested  to.  Penni Homans, MD

## 2017-02-02 NOTE — Patient Instructions (Addendum)
Check with pharmacy about tetanus shot if insurance will cover Hypertension Hypertension, commonly called high blood pressure, is when the force of blood pumping through the arteries is too strong. The arteries are the blood vessels that carry blood from the heart throughout the body. Hypertension forces the heart to work harder to pump blood and may cause arteries to become narrow or stiff. Having untreated or uncontrolled hypertension can cause heart attacks, strokes, kidney disease, and other problems. A blood pressure reading consists of a higher number over a lower number. Ideally, your blood pressure should be below 120/80. The first ("top") number is called the systolic pressure. It is a measure of the pressure in your arteries as your heart beats. The second ("bottom") number is called the diastolic pressure. It is a measure of the pressure in your arteries as the heart relaxes. What are the causes? The cause of this condition is not known. What increases the risk? Some risk factors for high blood pressure are under your control. Others are not. Factors you can change  Smoking.  Having type 2 diabetes mellitus, high cholesterol, or both.  Not getting enough exercise or physical activity.  Being overweight.  Having too much fat, sugar, calories, or salt (sodium) in your diet.  Drinking too much alcohol. Factors that are difficult or impossible to change  Having chronic kidney disease.  Having a family history of high blood pressure.  Age. Risk increases with age.  Race. You may be at higher risk if you are African-American.  Gender. Men are at higher risk than women before age 77. After age 92, women are at higher risk than men.  Having obstructive sleep apnea.  Stress. What are the signs or symptoms? Extremely high blood pressure (hypertensive crisis) may cause:  Headache.  Anxiety.  Shortness of breath.  Nosebleed.  Nausea and vomiting.  Severe chest  pain.  Jerky movements you cannot control (seizures).  How is this diagnosed? This condition is diagnosed by measuring your blood pressure while you are seated, with your arm resting on a surface. The cuff of the blood pressure monitor will be placed directly against the skin of your upper arm at the level of your heart. It should be measured at least twice using the same arm. Certain conditions can cause a difference in blood pressure between your right and left arms. Certain factors can cause blood pressure readings to be lower or higher than normal (elevated) for a short period of time:  When your blood pressure is higher when you are in a health care provider's office than when you are at home, this is called white coat hypertension. Most people with this condition do not need medicines.  When your blood pressure is higher at home than when you are in a health care provider's office, this is called masked hypertension. Most people with this condition may need medicines to control blood pressure.  If you have a high blood pressure reading during one visit or you have normal blood pressure with other risk factors:  You may be asked to return on a different day to have your blood pressure checked again.  You may be asked to monitor your blood pressure at home for 1 week or longer.  If you are diagnosed with hypertension, you may have other blood or imaging tests to help your health care provider understand your overall risk for other conditions. How is this treated? This condition is treated by making healthy lifestyle changes, such as eating  healthy foods, exercising more, and reducing your alcohol intake. Your health care provider may prescribe medicine if lifestyle changes are not enough to get your blood pressure under control, and if:  Your systolic blood pressure is above 130.  Your diastolic blood pressure is above 80.  Your personal target blood pressure may vary depending on your  medical conditions, your age, and other factors. Follow these instructions at home: Eating and drinking  Eat a diet that is high in fiber and potassium, and low in sodium, added sugar, and fat. An example eating plan is called the DASH (Dietary Approaches to Stop Hypertension) diet. To eat this way: ? Eat plenty of fresh fruits and vegetables. Try to fill half of your plate at each meal with fruits and vegetables. ? Eat whole grains, such as whole wheat pasta, brown rice, or whole grain bread. Fill about one quarter of your plate with whole grains. ? Eat or drink low-fat dairy products, such as skim milk or low-fat yogurt. ? Avoid fatty cuts of meat, processed or cured meats, and poultry with skin. Fill about one quarter of your plate with lean proteins, such as fish, chicken without skin, beans, eggs, and tofu. ? Avoid premade and processed foods. These tend to be higher in sodium, added sugar, and fat.  Reduce your daily sodium intake. Most people with hypertension should eat less than 1,500 mg of sodium a day.  Limit alcohol intake to no more than 1 drink a day for nonpregnant women and 2 drinks a day for men. One drink equals 12 oz of beer, 5 oz of wine, or 1 oz of hard liquor. Lifestyle  Work with your health care provider to maintain a healthy body weight or to lose weight. Ask what an ideal weight is for you.  Get at least 30 minutes of exercise that causes your heart to beat faster (aerobic exercise) most days of the week. Activities may include walking, swimming, or biking.  Include exercise to strengthen your muscles (resistance exercise), such as pilates or lifting weights, as part of your weekly exercise routine. Try to do these types of exercises for 30 minutes at least 3 days a week.  Do not use any products that contain nicotine or tobacco, such as cigarettes and e-cigarettes. If you need help quitting, ask your health care provider.  Monitor your blood pressure at home as  told by your health care provider.  Keep all follow-up visits as told by your health care provider. This is important. Medicines  Take over-the-counter and prescription medicines only as told by your health care provider. Follow directions carefully. Blood pressure medicines must be taken as prescribed.  Do not skip doses of blood pressure medicine. Doing this puts you at risk for problems and can make the medicine less effective.  Ask your health care provider about side effects or reactions to medicines that you should watch for. Contact a health care provider if:  You think you are having a reaction to a medicine you are taking.  You have headaches that keep coming back (recurring).  You feel dizzy.  You have swelling in your ankles.  You have trouble with your vision. Get help right away if:  You develop a severe headache or confusion.  You have unusual weakness or numbness.  You feel faint.  You have severe pain in your chest or abdomen.  You vomit repeatedly.  You have trouble breathing. Summary  Hypertension is when the force of blood pumping through  your arteries is too strong. If this condition is not controlled, it may put you at risk for serious complications.  Your personal target blood pressure may vary depending on your medical conditions, your age, and other factors. For most people, a normal blood pressure is less than 120/80.  Hypertension is treated with lifestyle changes, medicines, or a combination of both. Lifestyle changes include weight loss, eating a healthy, low-sodium diet, exercising more, and limiting alcohol. This information is not intended to replace advice given to you by your health care provider. Make sure you discuss any questions you have with your health care provider. Document Released: 06/16/2005 Document Revised: 05/14/2016 Document Reviewed: 05/14/2016 Elsevier Interactive Patient Education  [employer].

## 2017-02-02 NOTE — Assessment & Plan Note (Signed)
Well controlled, no changes to meds. Encouraged heart healthy diet such as the DASH diet and exercise as tolerated.  °

## 2017-02-02 NOTE — Assessment & Plan Note (Signed)
Doing well on current meds but daughters confirm she gets anxious with hot flashes daily. Uses Alprazolam bid, will increase Sertraline to 50 mg po daily.

## 2017-02-02 NOTE — Addendum Note (Signed)
Addended by: Magdalene Molly A on: 02/02/2017 09:37 AM   Modules accepted: Orders

## 2017-02-03 ENCOUNTER — Other Ambulatory Visit: Payer: Self-pay | Admitting: Family Medicine

## 2017-02-03 ENCOUNTER — Encounter: Payer: Self-pay | Admitting: Family Medicine

## 2017-02-03 MED ORDER — ALENDRONATE SODIUM 70 MG PO TABS: 70.0000 mg | ORAL_TABLET | ORAL | 4 refills | Status: DC

## 2017-02-18 ENCOUNTER — Encounter: Payer: Self-pay | Admitting: Family Medicine

## 2017-02-20 ENCOUNTER — Encounter: Payer: Self-pay | Admitting: Family Medicine

## 2017-03-09 ENCOUNTER — Encounter: Payer: Self-pay | Admitting: Family Medicine

## 2017-03-10 ENCOUNTER — Other Ambulatory Visit: Payer: Self-pay

## 2017-03-28 ENCOUNTER — Other Ambulatory Visit: Payer: Self-pay | Admitting: Family Medicine

## 2017-03-30 ENCOUNTER — Telehealth: Payer: Self-pay | Admitting: Family Medicine

## 2017-03-30 NOTE — Telephone Encounter (Signed)
Caller name: Relation to DD:UKGU Call back number:[phone] [facility]  Reason for call: pt is needing rx ALPRAZolam (XANAX) 1 MG tablet,

## 2017-03-30 NOTE — Telephone Encounter (Signed)
Requesting: Alprazolam Contract: Yes UDS: 6.29.18 low-risk next screen 12.29.18 Last OV: 8.6.18 Next OV: 11.26.18 Last Refill: 8.29.18   Please advise

## 2017-03-31 ENCOUNTER — Ambulatory Visit (INDEPENDENT_AMBULATORY_CARE_PROVIDER_SITE_OTHER): Payer: Medicare Other | Admitting: Family Medicine

## 2017-03-31 ENCOUNTER — Encounter: Payer: Self-pay | Admitting: Family Medicine

## 2017-03-31 VITALS — BP 154/86 | HR 63 | Temp 97.9°F | Resp 18 | Wt 97.0 lb

## 2017-03-31 DIAGNOSIS — I10 Essential (primary) hypertension: Secondary | ICD-10-CM

## 2017-03-31 DIAGNOSIS — F419 Anxiety disorder, unspecified: Secondary | ICD-10-CM

## 2017-03-31 DIAGNOSIS — R410 Disorientation, unspecified: Secondary | ICD-10-CM

## 2017-03-31 LAB — POC URINALSYSI DIPSTICK (AUTOMATED)
BILIRUBIN UA: NEGATIVE
Blood, UA: NEGATIVE
Glucose, UA: NEGATIVE
Ketones, UA: NEGATIVE
LEUKOCYTES UA: NEGATIVE
NITRITE UA: NEGATIVE
PH UA: 6 (ref 5.0–8.0)
PROTEIN UA: NEGATIVE
Spec Grav, UA: >=1.03 — AB (ref 1.010–1.025)
Urobilinogen, UA: 0.2 E.U./dL

## 2017-03-31 MED ORDER — LISINOPRIL 10 MG PO TABS: 10.0000 mg | ORAL_TABLET | Freq: Two times a day (BID) | ORAL | 1 refills | Status: DC

## 2017-03-31 NOTE — Progress Notes (Signed)
Subjective:  I acted as a Education administrator for Dr. Charlett Blake. [place], [place]  Patient ID: Peggy Peterson, female    DOB: 1926/07/20, 83 y.o.   MRN: [id number]  No chief complaint on file.   HPI  Patient is in today for an acute visit. She came in today to pick up a refill on her husband's medications and did not remember calling yesterday to request a refill so staff became worried. She acknowledges her husband's worsening memory but she reports he is better this week than last she feels well and is alert and oriented x 3 and denies any acute concerns otherwise. Denies CP/palp/SOB/HA/congestion/fevers/GI or GU c/o. Taking meds as prescribed  Patient Care Team: Mosie Lukes, MD as PCP - General (Family Medicine)   Past Medical History:  Diagnosis Date  . Anxiety   . H/O measles   . H/O mumps   . History of chicken pox   . Hyperlipidemia   . Hypertension   . Leukopenia 04/08/2015  . Paroxysmal atrial fibrillation ([place]) 04/05/2015  . Preventative health care 06/09/2016    Past Surgical History:  Procedure Laterality Date  . ABDOMINAL HYSTERECTOMY      Family History  Problem Relation Age of Onset  . Cancer Mother 42       leukemia   . Diabetes Father   . Cancer Father   . Arthritis Sister   . Dementia Brother   . Hip fracture Brother   . Diabetes Paternal Uncle     Social History   Social History  . Marital status: Married    Spouse name: N/A  . Number of children: N/A  . Years of education: N/A   Occupational History  . retired     Social History Main Topics  . Smoking status: Former Research scientist (life sciences)  . Smokeless tobacco: Never Used     Comment: Stopper smoking in 1960  . Alcohol use No  . Drug use: No  . Sexual activity: No     Comment: lives with husband, no dietary restrictions.   Other Topics Concern  . Not on file   Social History Narrative   Married cares for husband at home. No dietary restrictions    Outpatient Medications Prior to Visit  Medication Sig Dispense  Refill  . ALPRAZolam (XANAX) 1 MG tablet TAKE 1/2 TO 1 TABLET BY MOUTH TWICE DAILY AS NEEDED FOR ANXIETY 30 tablet 1  . Calcium Citrate-Vitamin D (CALCIUM CITRATE + PO) Take 2 tablets by mouth daily.    Marland Kitchen docusate sodium (COLACE) 100 MG capsule Take 100 mg by mouth daily as needed for mild constipation.    . gabapentin (NEURONTIN) 100 MG capsule TAKE 1 CAPSULE (100 MG TOTAL) BY MOUTH AT BEDTIME. 30 capsule 5  . metoprolol succinate (TOPROL-XL) 50 MG 24 hr tablet TAKE 1 TABLET (50 MG TOTAL) BY MOUTH DAILY. TAKE WITH OR IMMEDIATELY FOLLOWING A MEAL. 30 tablet 5  . sertraline (ZOLOFT) 50 MG tablet Take 1 tablet (50 mg total) by mouth daily. 30 tablet 3  . lisinopril (PRINIVIL,ZESTRIL) 10 MG tablet TAKE 1 TABLET BY MOUTH ONCE DAILY 90 tablet 2   No facility-administered medications prior to visit.     No Known Allergies  Review of Systems  Constitutional: Negative for fever and malaise/fatigue.  HENT: Negative for congestion.   Eyes: Negative for blurred vision.  Respiratory: Negative for shortness of breath.   Cardiovascular: Negative for chest pain, palpitations and leg swelling.  Gastrointestinal: Negative for abdominal pain, blood  in stool and nausea.  Genitourinary: Negative for dysuria and frequency.  Musculoskeletal: Negative for falls.  Skin: Negative for rash.  Neurological: Negative for dizziness, loss of consciousness and headaches.  Endo/Heme/Allergies: Negative for environmental allergies.  Psychiatric/Behavioral: Negative for depression. The patient is nervous/anxious.        Objective:    Physical Exam  Constitutional: She is oriented to person, place, and time. She appears well-developed and well-nourished. No distress.  HENT:  Head: Normocephalic and atraumatic.  Nose: Nose normal.  Eyes: Right eye exhibits no discharge. Left eye exhibits no discharge.  Neck: Normal range of motion. Neck supple.  Cardiovascular: Normal rate and regular rhythm.   No murmur  heard. Pulmonary/Chest: Effort normal and breath sounds normal.  Abdominal: Soft. Bowel sounds are normal. There is no tenderness.  Musculoskeletal: She exhibits no edema.  Neurological: She is alert and oriented to person, place, and time.  Skin: Skin is warm and dry.  Psychiatric: She has a normal mood and affect.  Nursing note and vitals reviewed.   BP (!) 154/86   Pulse 63   Temp 97.9 F (36.6 C) (Oral)   Resp 18   Wt 97 lb (44 kg)   SpO2 97%   BMI 18.94 kg/m  Wt Readings from Last 3 Encounters:  03/31/17 97 lb (44 kg)  02/02/17 95 lb 9.6 oz (43.4 kg)  06/09/16 95 lb 9.6 oz (43.4 kg)   BP Readings from Last 3 Encounters:  03/31/17 (!) 154/86  02/02/17 118/66  06/09/16 106/68     Immunization History  Administered Date(s) Administered  . Influenza, High Dose Seasonal PF 04/05/2015  . Influenza-Unspecified 05/02/2016  . Pneumococcal Conjugate-13 07/10/2015  . Pneumococcal Polysaccharide-23 02/02/2017    Health Maintenance  Topic Date Due  . Samul Dada  10/15/1945  . INFLUENZA VACCINE  01/28/2017  . DEXA SCAN  Completed  . PNA vac Low Risk Adult  Completed    Lab Results  Component Value Date   WBC 3.7 (L) 06/09/2016   HGB 13.8 06/09/2016   HCT 40.9 06/09/2016   PLT 160.0 06/09/2016   GLUCOSE 86 06/09/2016   CHOL 203 (H) 06/09/2016   TRIG 103.0 06/09/2016   HDL 70.60 06/09/2016   LDLCALC 111 (H) 06/09/2016   ALT 16 06/09/2016   AST 22 06/09/2016   NA 141 06/09/2016   K 4.3 06/09/2016   CL 103 06/09/2016   CREATININE 0.75 06/09/2016   BUN 15 06/09/2016   CO2 31 06/09/2016   TSH 2.40 06/09/2016    Lab Results  Component Value Date   TSH 2.40 06/09/2016   Lab Results  Component Value Date   WBC 3.7 (L) 06/09/2016   HGB 13.8 06/09/2016   HCT 40.9 06/09/2016   MCV 89.8 06/09/2016   PLT 160.0 06/09/2016   Lab Results  Component Value Date   NA 141 06/09/2016   K 4.3 06/09/2016   CO2 31 06/09/2016   GLUCOSE 86 06/09/2016   BUN 15  06/09/2016   CREATININE 0.75 06/09/2016   BILITOT 0.5 06/09/2016   ALKPHOS 69 06/09/2016   AST 22 06/09/2016   ALT 16 06/09/2016   PROT 6.9 06/09/2016   ALBUMIN 4.1 06/09/2016   CALCIUM 9.8 06/09/2016   GFR 77.57 06/09/2016   Lab Results  Component Value Date   CHOL 203 (H) 06/09/2016   Lab Results  Component Value Date   HDL 70.60 06/09/2016   Lab Results  Component Value Date   LDLCALC 111 (H) 06/09/2016  Lab Results  Component Value Date   TRIG 103.0 06/09/2016   Lab Results  Component Value Date   CHOLHDL 3 06/09/2016   No results found for: HGBA1C       Assessment & Plan:   Problem List Items Addressed This Visit    Anxiety    She came in today to pick up a refill on her husband's medications and did not remember calling yesterday to request a refill so staff became worried. She acknowledges her husband's worsening memory but she reports he is better this week than last she feels well and is alert and oriented x 3 and denies any acute concerns otherwise. Will check labs and reevaluate if no improvement      Hypertension    Elevated BP today but she is very agitated and notes that she has been struggling with her husband loosing more of his memory. She denies any falls, trauma, illness, fevers or acute infectious symptoms. Will increase Lisinopril 10 mg to bid and recheck BP in 1 month with cmp      Relevant Medications   lisinopril (PRINIVIL,ZESTRIL) 10 MG tablet   Other Relevant Orders   CBC   Comprehensive metabolic panel   TSH    Other Visit Diagnoses    Confusion    -  Primary   Relevant Orders   Urine Culture   POCT Urinalysis Dipstick (Automated) (Completed)      I have changed Ms. Peacock's lisinopril. I am also having her maintain her Calcium Citrate-Vitamin D (CALCIUM CITRATE + PO), docusate sodium, gabapentin, metoprolol succinate, sertraline, and ALPRAZolam.  Meds ordered this encounter  Medications  . lisinopril (PRINIVIL,ZESTRIL) 10 MG  tablet    Sig: Take 1 tablet (10 mg total) by mouth 2 (two) times daily.    Dispense:  180 tablet    Refill:  1    CMA served as scribe during this visit. History, Physical and Plan performed by medical provider. Documentation and orders reviewed and attested to.  Penni Homans, MD

## 2017-03-31 NOTE — Telephone Encounter (Signed)
Refill done and faxed

## 2017-03-31 NOTE — Assessment & Plan Note (Signed)
Elevated BP today but she is very agitated and notes that she has been struggling with her husband loosing more of his memory. She denies any falls, trauma, illness, fevers or acute infectious symptoms. Will increase Lisinopril 10 mg to bid and recheck BP in 1 month with cmp

## 2017-03-31 NOTE — Patient Instructions (Signed)

## 2017-03-31 NOTE — Telephone Encounter (Signed)
rx faxed

## 2017-03-31 NOTE — Assessment & Plan Note (Signed)
She came in today to pick up a refill on her husband's medications and did not remember calling yesterday to request a refill so staff became worried. She acknowledges her husband's worsening memory but she reports he is better this week than last she feels well and is alert and oriented x 3 and denies any acute concerns otherwise. Will check labs and reevaluate if no improvement

## 2017-04-01 ENCOUNTER — Encounter: Payer: Self-pay | Admitting: Family Medicine

## 2017-04-01 LAB — URINE CULTURE
MICRO NUMBER: 81092261
Result:: NO GROWTH
SPECIMEN QUALITY: ADEQUATE

## 2017-04-01 NOTE — Telephone Encounter (Signed)
Pt's daughter called in to follow up. She said that she is just concerned a little about her mom. She have noticed that she has gotten a little forgetful/confused.    She would like a call back.

## 2017-04-08 ENCOUNTER — Encounter: Payer: Self-pay | Admitting: Family Medicine

## 2017-04-27 ENCOUNTER — Encounter: Payer: Self-pay | Admitting: Family Medicine

## 2017-04-27 ENCOUNTER — Telehealth: Payer: Self-pay | Admitting: Family Medicine

## 2017-04-27 NOTE — Telephone Encounter (Signed)
Ronalee Belts with [facility] requesting 90 day supply of sertraline (ZOLOFT) 50 MG tablet for pt. Call back number is [phone]

## 2017-04-29 ENCOUNTER — Other Ambulatory Visit: Payer: Self-pay | Admitting: Family Medicine

## 2017-04-29 NOTE — Telephone Encounter (Signed)
Patient checking on the status of gabapentin (NEURONTIN) 100 MG capsule refill, please advise

## 2017-04-30 NOTE — Telephone Encounter (Signed)
rx was sent to pharmacy

## 2017-05-01 ENCOUNTER — Other Ambulatory Visit: Payer: Self-pay

## 2017-05-01 MED ORDER — SERTRALINE HCL 50 MG PO TABS: 50.0000 mg | ORAL_TABLET | Freq: Every day | ORAL | 3 refills | Status: DC

## 2017-05-02 ENCOUNTER — Other Ambulatory Visit: Payer: Self-pay | Admitting: Family Medicine

## 2017-05-05 ENCOUNTER — Encounter: Payer: Self-pay | Admitting: Family Medicine

## 2017-05-06 ENCOUNTER — Telehealth: Payer: Self-pay | Admitting: Family Medicine

## 2017-05-06 NOTE — Telephone Encounter (Signed)
[facility] generic toprol xr 50 mg tabs. Ronalee Belts at [facility] [address] [place]. [phone].

## 2017-05-07 ENCOUNTER — Other Ambulatory Visit: Payer: Self-pay | Admitting: Family Medicine

## 2017-05-07 MED ORDER — SERTRALINE HCL 50 MG PO TABS: 50.0000 mg | ORAL_TABLET | Freq: Every day | ORAL | 3 refills | Status: DC

## 2017-05-07 MED ORDER — ALPRAZOLAM 1 MG PO TABS: 0.5000 mg | ORAL_TABLET | Freq: Two times a day (BID) | ORAL | 1 refills | Status: DC | PRN

## 2017-05-07 MED ORDER — METOPROLOL SUCCINATE ER 50 MG PO TB24: 50.0000 mg | ORAL_TABLET | Freq: Every day | ORAL | 1 refills | Status: DC

## 2017-05-07 NOTE — Telephone Encounter (Signed)
Caller name: Relation to JK:KXFG Call back number:[phone] [email or web address] [place]  Reason for call: pt is needing rx ALPRAZolam (XANAX) 1 MG tablet, sertraline (ZOLOFT) 50 MG tablet, and gabapentin (NEURONTIN) 100 MG

## 2017-05-07 NOTE — Telephone Encounter (Signed)
rx sent in 

## 2017-05-07 NOTE — Telephone Encounter (Signed)
A Prior authorization has been sent to her insurance company

## 2017-05-07 NOTE — Telephone Encounter (Signed)
Requesting: xanax Contract:yes UDS:low risk next screen 06/27/17 Last OV:03/31/17 Next OV:11/261/8 Last Refill:03/30/17   #[id number]   Please advise

## 2017-05-08 NOTE — Telephone Encounter (Signed)
rx printed  Awaiting pcp sig

## 2017-05-11 ENCOUNTER — Ambulatory Visit: Payer: PRIVATE HEALTH INSURANCE | Admitting: Family Medicine

## 2017-05-11 ENCOUNTER — Encounter: Payer: Self-pay | Admitting: Family Medicine

## 2017-05-12 DIAGNOSIS — Z23 Encounter for immunization: Secondary | ICD-10-CM | POA: Diagnosis not present

## 2017-05-14 NOTE — Telephone Encounter (Signed)
Patient notified of rx pick up

## 2017-05-25 ENCOUNTER — Ambulatory Visit (INDEPENDENT_AMBULATORY_CARE_PROVIDER_SITE_OTHER): Payer: Medicare Other | Admitting: Family Medicine

## 2017-05-25 ENCOUNTER — Encounter: Payer: Self-pay | Admitting: Family Medicine

## 2017-05-25 VITALS — BP 119/61 | HR 69 | Temp 97.7°F | Resp 16 | Ht 59.84 in | Wt 94.8 lb

## 2017-05-25 DIAGNOSIS — E782 Mixed hyperlipidemia: Secondary | ICD-10-CM

## 2017-05-25 DIAGNOSIS — M810 Age-related osteoporosis without current pathological fracture: Secondary | ICD-10-CM | POA: Diagnosis not present

## 2017-05-25 DIAGNOSIS — I10 Essential (primary) hypertension: Secondary | ICD-10-CM

## 2017-05-25 DIAGNOSIS — D72819 Decreased white blood cell count, unspecified: Secondary | ICD-10-CM

## 2017-05-25 DIAGNOSIS — F419 Anxiety disorder, unspecified: Secondary | ICD-10-CM | POA: Diagnosis not present

## 2017-05-25 NOTE — Patient Instructions (Addendum)
Shingrix is the new shingles shot, 2 shots over 2-6 months at pharmacy  Osteoporosis Osteoporosis is the thinning and loss of density in the bones. Osteoporosis makes the bones more brittle, fragile, and likely to break (fracture). Over time, osteoporosis can cause the bones to become so weak that they fracture after a simple fall. The bones most likely to fracture are the bones in the hip, wrist, and spine. What are the causes? The exact cause is not known. What increases the risk? Anyone can develop osteoporosis. You may be at greater risk if you have a family history of the condition or have poor nutrition. You may also have a higher risk if you are:  Female.  81 years old or older.  A smoker.  Not physically active.  White or Asian.  Slender.  What are the signs or symptoms? A fracture might be the first sign of the disease, especially if it results from a fall or injury that would not usually cause a bone to break. Other signs and symptoms include:  Low back and neck pain.  Stooped posture.  Height loss.  How is this diagnosed? To make a diagnosis, your health care provider may:  Take a medical history.  Perform a physical exam.  Order tests, such as: ? A bone mineral density test. ? A dual-energy X-ray absorptiometry test.  How is this treated? The goal of osteoporosis treatment is to strengthen your bones to reduce your risk of a fracture. Treatment may involve:  Making lifestyle changes, such as: ? Eating a diet rich in calcium. ? Doing weight-bearing and muscle-strengthening exercises. ? Stopping tobacco use. ? Limiting alcohol intake.  Taking medicine to slow the process of bone loss or to increase bone density.  Monitoring your levels of calcium and vitamin D.  Follow these instructions at home:  Include calcium and vitamin D in your diet. Calcium is important for bone health, and vitamin D helps the body absorb calcium.  Perform weight-bearing and  muscle-strengthening exercises as directed by your health care provider.  Do not use any tobacco products, including cigarettes, chewing tobacco, and electronic cigarettes. If you need help quitting, ask your health care provider.  Limit your alcohol intake.  Take medicines only as directed by your health care provider.  Keep all follow-up visits as directed by your health care provider. This is important.  Take precautions at home to lower your risk of falling, such as: ? Keeping rooms well lit and clutter free. ? Installing safety rails on stairs. ? Using rubber mats in the bathroom and other areas that are often wet or slippery. Get help right away if: You fall or injure yourself. This information is not intended to replace advice given to you by your health care provider. Make sure you discuss any questions you have with your health care provider. Document Released: 03/26/2005 Document Revised: 11/19/2015 Document Reviewed: 11/24/2013 Elsevier Interactive Patient Education  2017 [employer].

## 2017-05-25 NOTE — Assessment & Plan Note (Signed)
Check cbc today 

## 2017-05-25 NOTE — Assessment & Plan Note (Signed)
Well controlled, no changes to meds. Encouraged heart healthy diet such as the DASH diet and exercise as tolerated.  °

## 2017-05-25 NOTE — Progress Notes (Signed)
Subjective:  I acted as a Education administrator for [employer]. [name], [place]   Patient ID: Peggy Peterson, female    DOB: [birth year], 83 y.o.   MRN: [id number]  Chief Complaint  Patient presents with  . Follow-up    HPI  Patient is in today for follow up visit and is accompanied by her husband and daughters.  Overall she is doing well.  No recent febrile illness or hospitalization.  She and her daughters are acknowledging she has had some mild memory lapses lately but does manage the activities of daily living and caring with her husband fairly well.  She continues to have anxiety but is not escalating.  Alprazolam is working well.  No other acute concerns. Well controlled, no changes to meds. Encouraged heart healthy diet such as the DASH diet and exercise as tolerated.   Patient Care Team: Mosie Lukes, MD as PCP - General (Family Medicine)   Past Medical History:  Diagnosis Date  . Anxiety   . H/O measles   . H/O mumps   . History of chicken pox   . Hyperlipidemia   . Hypertension   . Leukopenia 04/08/2015  . Paroxysmal atrial fibrillation ([place]) 04/05/2015  . Preventative health care 06/09/2016    Past Surgical History:  Procedure Laterality Date  . ABDOMINAL HYSTERECTOMY      Family History  Problem Relation Age of Onset  . Cancer Mother 41       leukemia   . Diabetes Father   . Cancer Father   . Arthritis Sister   . Dementia Brother   . Hip fracture Brother   . Diabetes Paternal Uncle     Social History   Socioeconomic History  . Marital status: Married    Spouse name: Not on file  . Number of children: Not on file  . Years of education: Not on file  . Highest education level: Not on file  Social Needs  . Financial resource strain: Not on file  . Food insecurity - worry: Not on file  . Food insecurity - inability: Not on file  . Transportation needs - medical: Not on file  . Transportation needs - non-medical: Not on file  Occupational History  . Occupation: retired    Tobacco Use  . Smoking status: Former Research scientist (life sciences)  . Smokeless tobacco: Never Used  . Tobacco comment: Stopper smoking in 1960  Substance and Sexual Activity  . Alcohol use: No    Alcohol/week: 0.0 oz  . Drug use: No  . Sexual activity: No    Comment: lives with husband, no dietary restrictions.  Other Topics Concern  . Not on file  Social History Narrative   Married cares for husband at home. No dietary restrictions    Outpatient Medications Prior to Visit  Medication Sig Dispense Refill  . ALPRAZolam (XANAX) 1 MG tablet Take 0.5-1 tablets (0.5-1 mg total) 2 (two) times daily as needed by mouth. for anxiety 30 tablet 1  . Calcium Citrate-Vitamin D (CALCIUM CITRATE + PO) Take 2 tablets by mouth daily.    Marland Kitchen docusate sodium (COLACE) 100 MG capsule Take 100 mg by mouth daily as needed for mild constipation.    . gabapentin (NEURONTIN) 100 MG capsule TAKE 1 CAPSULE (100 MG TOTAL) BY MOUTH AT BEDTIME. 30 capsule 5  . lisinopril (PRINIVIL,ZESTRIL) 10 MG tablet Take 1 tablet (10 mg total) by mouth 2 (two) times daily. 180 tablet 1  . metoprolol succinate (TOPROL-XL) 50 MG 24 hr tablet Take  1 tablet (50 mg total) daily by mouth. Take with or immediately following a meal. 90 tablet 1  . sertraline (ZOLOFT) 50 MG tablet Take 1 tablet (50 mg total) daily by mouth. 30 tablet 3   No facility-administered medications prior to visit.     Allergies  Allergen Reactions  . Fosamax [Alendronate Sodium] Other (See Comments)    Abdominal pain    Review of Systems  Constitutional: Negative for fever and malaise/fatigue.  HENT: Negative for congestion.   Eyes: Negative for blurred vision.  Respiratory: Negative for shortness of breath.   Cardiovascular: Negative for chest pain, palpitations and leg swelling.  Gastrointestinal: Negative for abdominal pain, blood in stool and nausea.  Genitourinary: Negative for dysuria and frequency.  Musculoskeletal: Negative for falls.  Skin: Negative for rash.    Neurological: Negative for dizziness, loss of consciousness and headaches.  Endo/Heme/Allergies: Negative for environmental allergies.  Psychiatric/Behavioral: Positive for memory loss. Negative for depression. The patient is nervous/anxious.        Objective:    Physical Exam  Constitutional: She is oriented to person, place, and time. She appears well-developed and well-nourished. No distress.  HENT:  Head: Normocephalic and atraumatic.  Nose: Nose normal.  Eyes: Right eye exhibits no discharge. Left eye exhibits no discharge.  Neck: Normal range of motion. Neck supple.  Cardiovascular: Normal rate and regular rhythm.  No murmur heard. Pulmonary/Chest: Effort normal and breath sounds normal.  Abdominal: Soft. Bowel sounds are normal. There is no tenderness.  Musculoskeletal: She exhibits no edema.  Neurological: She is alert and oriented to person, place, and time.  Skin: Skin is warm and dry.  Psychiatric: She has a normal mood and affect.  Nursing note and vitals reviewed.   BP 119/61 (BP Location: Left Arm, Patient Position: Sitting, Cuff Size: Small)   Pulse 69   Temp 97.7 F (36.5 C) (Oral)   Resp 16   Ht 4' 11.84" (1.52 m)   Wt 94 lb 12.8 oz (43 kg)   SpO2 99%   BMI 18.61 kg/m  Wt Readings from Last 3 Encounters:  05/25/17 94 lb 12.8 oz (43 kg)  03/31/17 97 lb (44 kg)  02/02/17 95 lb 9.6 oz (43.4 kg)   BP Readings from Last 3 Encounters:  05/25/17 119/61  03/31/17 (!) 154/86  02/02/17 118/66     Immunization History  Administered Date(s) Administered  . Influenza, High Dose Seasonal PF 04/05/2015  . Influenza-Unspecified 05/02/2016  . Pneumococcal Conjugate-13 07/10/2015  . Pneumococcal Polysaccharide-23 02/02/2017    Health Maintenance  Topic Date Due  . Samul Dada  10/15/1945  . INFLUENZA VACCINE  01/28/2017  . DEXA SCAN  Completed  . PNA vac Low Risk Adult  Completed    Lab Results  Component Value Date   WBC 4.4 05/25/2017   HGB 14.3  05/25/2017   HCT 44.5 05/25/2017   PLT 172.0 05/25/2017   GLUCOSE 101 (H) 05/25/2017   CHOL 239 (H) 05/25/2017   TRIG 100.0 05/25/2017   HDL 65.30 05/25/2017   LDLCALC 154 (H) 05/25/2017   ALT 14 05/25/2017   AST 21 05/25/2017   NA 141 05/25/2017   K 5.0 05/25/2017   CL 103 05/25/2017   CREATININE 0.88 05/25/2017   BUN 23 05/25/2017   CO2 30 05/25/2017   TSH 3.12 05/25/2017    Lab Results  Component Value Date   TSH 3.12 05/25/2017   Lab Results  Component Value Date   WBC 4.4 05/25/2017   HGB  14.3 05/25/2017   HCT 44.5 05/25/2017   MCV 92.8 05/25/2017   PLT 172.0 05/25/2017   Lab Results  Component Value Date   NA 141 05/25/2017   K 5.0 05/25/2017   CO2 30 05/25/2017   GLUCOSE 101 (H) 05/25/2017   BUN 23 05/25/2017   CREATININE 0.88 05/25/2017   BILITOT 0.7 05/25/2017   ALKPHOS 75 05/25/2017   AST 21 05/25/2017   ALT 14 05/25/2017   PROT 7.2 05/25/2017   ALBUMIN 4.3 05/25/2017   CALCIUM 9.9 05/25/2017   GFR 64.36 05/25/2017   Lab Results  Component Value Date   CHOL 239 (H) 05/25/2017   Lab Results  Component Value Date   HDL 65.30 05/25/2017   Lab Results  Component Value Date   LDLCALC 154 (H) 05/25/2017   Lab Results  Component Value Date   TRIG 100.0 05/25/2017   Lab Results  Component Value Date   CHOLHDL 4 05/25/2017   No results found for: HGBA1C       Assessment & Plan:   Problem List Items Addressed This Visit    Anxiety    Handling the stress of handling her husband significant health concerns and she and her daughters acknowledge that there are also some mild memory deficits but overall she feels she is handling her anxiety well.  The alprazolam is working well and her daughters do not see any concerning side effects.      Hyperlipidemia    Encouraged heart healthy diet, increase exercise, avoid trans fats, consider a krill oil cap daily      Relevant Orders   Lipid panel (Completed)   Hypertension    Well controlled,  no changes to meds. Encouraged heart healthy diet such as the DASH diet and exercise as tolerated.       Relevant Orders   Comprehensive metabolic panel (Completed)   TSH (Completed)   Leukopenia    Check cbc today      Relevant Orders   CBC (Completed)    Other Visit Diagnoses    Osteoporosis, unspecified osteoporosis type, unspecified pathological fracture presence    -  Primary   Relevant Orders   VITAMIN D 25 Hydroxy (Vit-D Deficiency, Fractures) (Completed)      I am having Peggy Peterson maintain her Calcium Citrate-Vitamin D (CALCIUM CITRATE + PO), docusate sodium, lisinopril, gabapentin, metoprolol succinate, ALPRAZolam, and sertraline.  No orders of the defined types were placed in this encounter.   CMA served as Education administrator during this visit. History, Physical and Plan performed by medical provider. Documentation and orders reviewed and attested to.  Penni Homans, MD

## 2017-05-25 NOTE — Assessment & Plan Note (Signed)
Encouraged heart healthy diet, increase exercise, avoid trans fats, consider a krill oil cap daily 

## 2017-05-26 LAB — COMPREHENSIVE METABOLIC PANEL
ALBUMIN: 4.3 g/dL (ref 3.5–5.2)
ALT: 14 U/L (ref 0–35)
AST: 21 U/L (ref 0–37)
Alkaline Phosphatase: 75 U/L (ref 39–117)
BUN: 23 mg/dL (ref 6–23)
CHLORIDE: 103 meq/L (ref 96–112)
CO2: 30 mEq/L (ref 19–32)
Calcium: 9.9 mg/dL (ref 8.4–10.5)
Creatinine, Ser: 0.88 mg/dL (ref 0.40–1.20)
GFR: 64.36 mL/min (ref >60.00)
Glucose, Bld: 101 mg/dL — ABNORMAL HIGH (ref 70–99)
POTASSIUM: 5 meq/L (ref 3.5–5.1)
SODIUM: 141 meq/L (ref 135–145)
Total Bilirubin: 0.7 mg/dL (ref 0.2–1.2)
Total Protein: 7.2 g/dL (ref 6.0–8.3)

## 2017-05-26 LAB — LIPID PANEL
CHOL/HDL RATIO: 4
Cholesterol: 239 mg/dL — ABNORMAL HIGH (ref 0–200)
HDL: 65.3 mg/dL (ref >39.00)
LDL Cholesterol: 154 mg/dL — ABNORMAL HIGH (ref 0–99)
NONHDL: 173.67
Triglycerides: 100 mg/dL (ref 0.0–149.0)
VLDL: 20 mg/dL (ref 0.0–40.0)

## 2017-05-26 LAB — CBC
HEMATOCRIT: 44.5 % (ref 36.0–46.0)
Hemoglobin: 14.3 g/dL (ref 12.0–15.0)
MCHC: 32.1 g/dL (ref 30.0–36.0)
MCV: 92.8 fl (ref 78.0–100.0)
Platelets: 172 10*3/uL (ref 150.0–400.0)
RBC: 4.8 Mil/uL (ref 3.87–5.11)
RDW: 14.8 % (ref 11.5–15.5)
WBC: 4.4 10*3/uL (ref 4.0–10.5)

## 2017-05-26 LAB — TSH: TSH: 3.12 u[IU]/mL (ref 0.35–4.50)

## 2017-05-26 LAB — VITAMIN D 25 HYDROXY (VIT D DEFICIENCY, FRACTURES): VITD: 30.2 ng/mL (ref 30.00–100.00)

## 2017-05-27 ENCOUNTER — Encounter: Payer: Self-pay | Admitting: Family Medicine

## 2017-05-27 NOTE — Assessment & Plan Note (Signed)
Handling the stress of handling her husband significant health concerns and she and her daughters acknowledge that there are also some mild memory deficits but overall she feels she is handling her anxiety well.  The alprazolam is working well and her daughters do not see any concerning side effects.

## 2017-06-02 ENCOUNTER — Telehealth: Payer: Self-pay | Admitting: Family Medicine

## 2017-06-02 NOTE — Telephone Encounter (Signed)
Prolia benefits received [place] NOT required $183 deductible has been met Prolia and admin subject to 20% co-insurance  Patient may owe approximately $220 OOP  Letter mailed to inform patient of benefits and to schedule

## 2017-06-26 NOTE — Telephone Encounter (Signed)
Patient contacted about benefits    Peggy Peterson can we go ahead and order Prolia thanks

## 2017-07-08 ENCOUNTER — Other Ambulatory Visit: Payer: Self-pay | Admitting: Family Medicine

## 2017-07-08 NOTE — Telephone Encounter (Signed)
Copied from [place] [id number]. Topic: Quick Communication - Rx Refill/Question >> Jul 08, 2017  9:27 AM Lolita Rieger, RMA wrote: Medication: xanax   Has the patient contacted their pharmacy? no   (Agent: If no, request that the patient contact the pharmacy for the refill.)   Preferred Pharmacy (with phone number or street name):[facility] on [address]: Please be advised that RX refills may take up to 3 business days. We ask that you follow-up with your pharmacy.

## 2017-07-09 ENCOUNTER — Other Ambulatory Visit: Payer: Self-pay | Admitting: Family Medicine

## 2017-07-09 MED ORDER — ALPRAZOLAM 1 MG PO TABS: 0.5000 mg | ORAL_TABLET | Freq: Two times a day (BID) | ORAL | 1 refills | Status: DC | PRN

## 2017-07-09 NOTE — Telephone Encounter (Signed)
Requesting:xanax Contract:yes UDS:low risk next screen 12/27/17 Last OV:05/25/17 Next OV:08/10/17 Last Refill:05/07/17 [id number]   Please advise

## 2017-07-10 NOTE — Telephone Encounter (Signed)
rx faxed to pharmacy

## 2017-07-16 ENCOUNTER — Telehealth: Payer: Self-pay | Admitting: Family Medicine

## 2017-07-16 NOTE — Telephone Encounter (Signed)
Copied from [place] [id number]. Topic: Quick Communication - Rx Refill/Question >> Jul 08, 2017  9:27 AM Lolita Rieger, RMA wrote: Medication: xanax   Has the patient contacted their pharmacy? no   (Agent: If no, request that the patient contact the pharmacy for the refill.)   Preferred Pharmacy (with phone number or street name):[facility] on [address]: Please be advised that RX refills may take up to 3 business days. We ask that you follow-up with your pharmacy. >> Jul 08, 2017  9:49 AM Lars Masson, LPN wrote: This is not a [employer] patient.

## 2017-07-17 NOTE — Telephone Encounter (Signed)
Patient picked rx from pharmacy on 07/10/17

## 2017-07-24 NOTE — Telephone Encounter (Signed)
Prolia benefits received PA not required $185 ded 20% co-insurance   Patient may owe approximately $58 OOP    Letter mailed to inform patient of benefits and to schedule

## 2017-07-27 ENCOUNTER — Other Ambulatory Visit: Payer: Self-pay | Admitting: Family Medicine

## 2017-08-03 ENCOUNTER — Telehealth: Payer: Self-pay | Admitting: Family Medicine

## 2017-08-03 MED ORDER — GABAPENTIN 100 MG PO CAPS: ORAL_CAPSULE | ORAL | 5 refills | Status: DC

## 2017-08-03 NOTE — Telephone Encounter (Signed)
Medication sent in. 

## 2017-08-03 NOTE — Telephone Encounter (Signed)
Copied from [place] [phone]. Topic: Quick Communication - See Telephone Encounter >> Aug 03, 2017  2:03 PM Vernona Rieger wrote: CRM for notification. See Telephone encounter for:   08/03/17.  [facility]ab[facility]entin (NEURONTIN) 100 MG capsule   Please advise. They said they have sent it twice. He said she has some but needs it for [id number] [place] [place]

## 2017-08-10 ENCOUNTER — Encounter: Payer: Self-pay | Admitting: Family Medicine

## 2017-08-10 ENCOUNTER — Ambulatory Visit (INDEPENDENT_AMBULATORY_CARE_PROVIDER_SITE_OTHER): Payer: Medicare HMO | Admitting: Family Medicine

## 2017-08-10 VITALS — BP 126/75 | HR 64 | Temp 97.7°F | Resp 14 | Wt 95.0 lb

## 2017-08-10 DIAGNOSIS — M810 Age-related osteoporosis without current pathological fracture: Secondary | ICD-10-CM | POA: Diagnosis not present

## 2017-08-10 DIAGNOSIS — H6122 Impacted cerumen, left ear: Secondary | ICD-10-CM | POA: Diagnosis not present

## 2017-08-10 DIAGNOSIS — I10 Essential (primary) hypertension: Secondary | ICD-10-CM | POA: Diagnosis not present

## 2017-08-10 DIAGNOSIS — H6120 Impacted cerumen, unspecified ear: Secondary | ICD-10-CM | POA: Insufficient documentation

## 2017-08-10 DIAGNOSIS — E875 Hyperkalemia: Secondary | ICD-10-CM | POA: Diagnosis not present

## 2017-08-10 DIAGNOSIS — E782 Mixed hyperlipidemia: Secondary | ICD-10-CM | POA: Diagnosis not present

## 2017-08-10 LAB — COMPREHENSIVE METABOLIC PANEL
ALBUMIN: 4.1 g/dL (ref 3.5–5.2)
ALK PHOS: 79 U/L (ref 39–117)
ALT: 15 U/L (ref 0–35)
AST: 20 U/L (ref 0–37)
BUN: 20 mg/dL (ref 6–23)
CALCIUM: 9.6 mg/dL (ref 8.4–10.5)
CHLORIDE: 103 meq/L (ref 96–112)
CO2: 32 mEq/L (ref 19–32)
CREATININE: 0.88 mg/dL (ref 0.40–1.20)
GFR: 64.33 mL/min (ref >60.00)
Glucose, Bld: 99 mg/dL (ref 70–99)
Potassium: 5.3 mEq/L — ABNORMAL HIGH (ref 3.5–5.1)
Sodium: 141 mEq/L (ref 135–145)
TOTAL PROTEIN: 6.9 g/dL (ref 6.0–8.3)
Total Bilirubin: 0.5 mg/dL (ref 0.2–1.2)

## 2017-08-10 LAB — VITAMIN D 25 HYDROXY (VIT D DEFICIENCY, FRACTURES): VITD: 31.62 ng/mL (ref 30.00–100.00)

## 2017-08-10 NOTE — Patient Instructions (Signed)
shingrix is the new shingles shot, 2 shots over 2-6 months at pharmacy Osteoporosis Osteoporosis happens when your bones become thinner and weaker. Weak bones can break (fracture) more easily when you slip or fall. Bones most at risk of breaking are in the hip, wrist, and spine. Follow these instructions at home:  Get enough calcium and vitamin D. These nutrients are good for your bones.  Exercise as told by your doctor.  Do not use any tobacco products. This includes cigarettes, chewing tobacco, and electronic cigarettes. If you need help quitting, ask your doctor.  Limit the amount of alcohol you drink.  Take medicines only as told by your doctor.  Keep all follow-up visits as told by your doctor. This is important.  Take care at home to prevent falls. Some ways to do this are: ? Keep rooms well lit and tidy. ? Put safety rails on your stairs. ? Put a rubber mat in the bathroom and other places that are often wet or slippery. Get help right away if:  You fall.  You hurt yourself. This information is not intended to replace advice given to you by your health care provider. Make sure you discuss any questions you have with your health care provider. Document Released: 09/08/2011 Document Revised: 11/22/2015 Document Reviewed: 11/24/2013 Elsevier Interactive Patient Education  [employer].

## 2017-08-10 NOTE — Assessment & Plan Note (Signed)
Encouraged heart healthy diet, increase exercise, avoid trans fats, consider a krill oil cap daily 

## 2017-08-10 NOTE — Assessment & Plan Note (Signed)
Check cmp and vitamin D and proceed with approval of Prolia.

## 2017-08-10 NOTE — Progress Notes (Signed)
Subjective:  I acted as a Education administrator for [employer]. Peggy Peterson, [place]   Patient ID: Peggy Peterson, female    DOB: 1927-01-05, 84 y.o.   MRN: [id number]  No chief complaint on file.   HPI  Patient is in today for follow up visit on Osteoporosis. She is accompanied by her husband and daughters. She feels well. No recent febrile illness or hospitalization. She notes sudden sense of hearing loss in left ear after a shower in last couple of days. No tinnitus, pain or congestion. Denies CP/palp/SOB/HA/congestion/fevers/GI or GU c/o. Taking meds as prescribed  Patient Care Team: Mosie Lukes, MD as PCP - General (Family Medicine)   Past Medical History:  Diagnosis Date  . Anxiety   . H/O measles   . H/O mumps   . History of chicken pox   . Hyperlipidemia   . Hypertension   . Leukopenia 04/08/2015  . Paroxysmal atrial fibrillation ([place]) 04/05/2015  . Preventative health care 06/09/2016    Past Surgical History:  Procedure Laterality Date  . ABDOMINAL HYSTERECTOMY      Family History  Problem Relation Age of Onset  . Cancer Mother 77       leukemia   . Diabetes Father   . Cancer Father   . Arthritis Sister   . Dementia Brother   . Hip fracture Brother   . Diabetes Paternal Uncle     Social History   Socioeconomic History  . Marital status: Married    Spouse name: Not on file  . Number of children: Not on file  . Years of education: Not on file  . Highest education level: Not on file  Social Needs  . Financial resource strain: Not on file  . Food insecurity - worry: Not on file  . Food insecurity - inability: Not on file  . Transportation needs - medical: Not on file  . Transportation needs - non-medical: Not on file  Occupational History  . Occupation: retired   Tobacco Use  . Smoking status: Former Research scientist (life sciences)  . Smokeless tobacco: Never Used  . Tobacco comment: Stopper smoking in 1960  Substance and Sexual Activity  . Alcohol use: No    Alcohol/week: 0.0 oz  . Drug use:  No  . Sexual activity: No    Comment: lives with husband, no dietary restrictions.  Other Topics Concern  . Not on file  Social History Narrative   Married cares for husband at home. No dietary restrictions    Outpatient Medications Prior to Visit  Medication Sig Dispense Refill  . ALPRAZolam (XANAX) 1 MG tablet Take 0.5-1 tablets (0.5-1 mg total) by mouth 2 (two) times daily as needed. for anxiety 30 tablet 1  . Calcium Citrate-Vitamin D (CALCIUM CITRATE + PO) Take 2 tablets by mouth daily.    Marland Kitchen docusate sodium (COLACE) 100 MG capsule Take 100 mg by mouth daily as needed for mild constipation.    . gabapentin (NEURONTIN) 100 MG capsule TAKE 1 CAPSULE (100 MG TOTAL) BY MOUTH AT BEDTIME. 30 capsule 5  . lisinopril (PRINIVIL,ZESTRIL) 10 MG tablet Take 1 tablet (10 mg total) by mouth 2 (two) times daily. 180 tablet 1  . metoprolol succinate (TOPROL-XL) 50 MG 24 hr tablet Take 1 tablet (50 mg total) daily by mouth. Take with or immediately following a meal. 90 tablet 1  . sertraline (ZOLOFT) 50 MG tablet TAKE 1 TABLET (50 MG TOTAL) DAILY 30 tablet 0   No facility-administered medications prior to visit.  Allergies  Allergen Reactions  . Fosamax [Alendronate Sodium] Other (See Comments)    Abdominal pain    Review of Systems  Constitutional: Negative for fever and malaise/fatigue.  HENT: Positive for hearing loss. Negative for congestion, ear discharge, ear pain and tinnitus.   Eyes: Negative for blurred vision.  Respiratory: Negative for shortness of breath.   Cardiovascular: Negative for chest pain, palpitations and leg swelling.  Gastrointestinal: Negative for abdominal pain, blood in stool and nausea.  Genitourinary: Negative for dysuria and frequency.  Musculoskeletal: Negative for falls.  Skin: Negative for rash.  Neurological: Negative for dizziness, loss of consciousness and headaches.  Endo/Heme/Allergies: Negative for environmental allergies.  Psychiatric/Behavioral:  Negative for depression. The patient is not nervous/anxious.        Objective:    Physical Exam  Constitutional: She is oriented to person, place, and time. She appears well-developed and well-nourished. No distress.  HENT:  Head: Normocephalic and atraumatic.  Nose: Nose normal.  Eyes: Right eye exhibits no discharge. Left eye exhibits no discharge.  Neck: Normal range of motion. Neck supple.  Cardiovascular: Normal rate and regular rhythm.  No murmur heard. Pulmonary/Chest: Effort normal and breath sounds normal.  Abdominal: Soft. Bowel sounds are normal. There is no tenderness.  Musculoskeletal: She exhibits no edema.  Neurological: She is alert and oriented to person, place, and time.  Skin: Skin is warm and dry.  Psychiatric: She has a normal mood and affect.  Nursing note and vitals reviewed.   BP 126/75 (BP Location: Left Arm, Patient Position: Sitting, Cuff Size: Normal)   Pulse 64   Temp 97.7 F (36.5 C) (Oral)   Resp 14   Wt 95 lb (43.1 kg)   SpO2 97%   BMI 18.65 kg/m  Wt Readings from Last 3 Encounters:  08/10/17 95 lb (43.1 kg)  05/25/17 94 lb 12.8 oz (43 kg)  03/31/17 97 lb (44 kg)   BP Readings from Last 3 Encounters:  08/10/17 126/75  05/25/17 119/61  03/31/17 (!) 154/86     Immunization History  Administered Date(s) Administered  . Influenza, High Dose Seasonal PF 04/05/2015  . Influenza-Unspecified 05/02/2016  . Pneumococcal Conjugate-13 07/10/2015  . Pneumococcal Polysaccharide-23 02/02/2017    Health Maintenance  Topic Date Due  . Samul Dada  10/15/1945  . INFLUENZA VACCINE  Completed  . DEXA SCAN  Completed  . PNA vac Low Risk Adult  Completed    Lab Results  Component Value Date   WBC 4.4 05/25/2017   HGB 14.3 05/25/2017   HCT 44.5 05/25/2017   PLT 172.0 05/25/2017   GLUCOSE 99 08/10/2017   CHOL 239 (H) 05/25/2017   TRIG 100.0 05/25/2017   HDL 65.30 05/25/2017   LDLCALC 154 (H) 05/25/2017   ALT 15 08/10/2017   AST 20  08/10/2017   NA 141 08/10/2017   K 5.3 (H) 08/10/2017   CL 103 08/10/2017   CREATININE 0.88 08/10/2017   BUN 20 08/10/2017   CO2 32 08/10/2017   TSH 3.12 05/25/2017    Lab Results  Component Value Date   TSH 3.12 05/25/2017   Lab Results  Component Value Date   WBC 4.4 05/25/2017   HGB 14.3 05/25/2017   HCT 44.5 05/25/2017   MCV 92.8 05/25/2017   PLT 172.0 05/25/2017   Lab Results  Component Value Date   NA 141 08/10/2017   K 5.3 (H) 08/10/2017   CO2 32 08/10/2017   GLUCOSE 99 08/10/2017   BUN 20 08/10/2017   CREATININE  0.88 08/10/2017   BILITOT 0.5 08/10/2017   ALKPHOS 79 08/10/2017   AST 20 08/10/2017   ALT 15 08/10/2017   PROT 6.9 08/10/2017   ALBUMIN 4.1 08/10/2017   CALCIUM 9.6 08/10/2017   GFR 64.33 08/10/2017   Lab Results  Component Value Date   CHOL 239 (H) 05/25/2017   Lab Results  Component Value Date   HDL 65.30 05/25/2017   Lab Results  Component Value Date   LDLCALC 154 (H) 05/25/2017   Lab Results  Component Value Date   TRIG 100.0 05/25/2017   Lab Results  Component Value Date   CHOLHDL 4 05/25/2017   No results found for: HGBA1C       Assessment & Plan:   Problem List Items Addressed This Visit    Hyperlipidemia    Encouraged heart healthy diet, increase exercise, avoid trans fats, consider a krill oil cap daily      Hypertension    Well controlled, no changes to meds. Encouraged heart healthy diet such as the DASH diet and exercise as tolerated.       Cerumen impaction    Impacted but removed easily with irrigation. She hears much better once it is removed.       Osteoporosis - Primary    Check cmp and vitamin D and proceed with approval of Prolia.       Relevant Orders   Comprehensive metabolic panel (Completed)   VITAMIN D 25 Hydroxy (Vit-D Deficiency, Fractures) (Completed)      I am having Peggy Peterson maintain her Calcium Citrate-Vitamin D (CALCIUM CITRATE + PO), docusate sodium, lisinopril, metoprolol  succinate, ALPRAZolam, sertraline, and gabapentin.  No orders of the defined types were placed in this encounter.   CMA served as Education administrator during this visit. History, Physical and Plan performed by medical provider. Documentation and orders reviewed and attested to.  Penni Homans, MD

## 2017-08-10 NOTE — Assessment & Plan Note (Signed)
Well controlled, no changes to meds. Encouraged heart healthy diet such as the DASH diet and exercise as tolerated.  °

## 2017-08-10 NOTE — Assessment & Plan Note (Signed)
Impacted but removed easily with irrigation. She hears much better once it is removed.

## 2017-08-11 NOTE — Addendum Note (Signed)
Addended by: Lawana Chambers on: 08/11/2017 06:47 PM   Modules accepted: Orders

## 2017-08-23 ENCOUNTER — Other Ambulatory Visit: Payer: Self-pay | Admitting: Family Medicine

## 2017-08-25 ENCOUNTER — Other Ambulatory Visit: Payer: Self-pay | Admitting: Family Medicine

## 2017-08-25 MED ORDER — ALPRAZOLAM 1 MG PO TABS: 0.5000 mg | ORAL_TABLET | Freq: Two times a day (BID) | ORAL | 1 refills | Status: DC | PRN

## 2017-08-25 NOTE — Telephone Encounter (Signed)
Requesting: alprazolam Contract:12/25/16  UDS: 12/26/16 low risk Last Visit: 08/10/17 Next Visit:11/02/17 Last Refill: 07/09/17 1 refill  Please Advise

## 2017-08-25 NOTE — Telephone Encounter (Signed)
Xanax refill request - controlled substance  LOV  08/10/17  Dr. Charlett [facility]  [phone] [place] [place]  [place]

## 2017-08-25 NOTE — Telephone Encounter (Signed)
Copied from [place] [id number]. Topic: Quick Communication - Rx Refill/Question >> Aug 25, 2017 11:22 AM Pricilla Handler wrote: Medication: ALPRAZolam Duanne Moron) 1 MG tablet Has the patient contacted their pharmacy? Yes.   (Agent: If no, request that the patient contact the pharmacy for the refill.) Preferred Pharmacy (with phone number or street name): [facility] [place] [place] [facility] [place] [phone] (Phone) [phone] (Fax)   Agent: Please be advised that RX refills may take up to 3 business days. We ask that you follow-up with your pharmacy.

## 2017-08-31 ENCOUNTER — Ambulatory Visit: Payer: Medicare Other | Admitting: Family Medicine

## 2017-09-14 ENCOUNTER — Encounter: Payer: Self-pay | Admitting: Family Medicine

## 2017-09-14 DIAGNOSIS — N390 Urinary tract infection, site not specified: Secondary | ICD-10-CM | POA: Diagnosis not present

## 2017-09-14 DIAGNOSIS — F0390 Unspecified dementia without behavioral disturbance: Secondary | ICD-10-CM | POA: Insufficient documentation

## 2017-09-14 DIAGNOSIS — B9689 Other specified bacterial agents as the cause of diseases classified elsewhere: Secondary | ICD-10-CM | POA: Diagnosis not present

## 2017-09-14 DIAGNOSIS — R69 Illness, unspecified: Secondary | ICD-10-CM | POA: Diagnosis not present

## 2017-09-16 MED ORDER — CEPHALEXIN 500 MG PO CAPS
500.00 | ORAL_CAPSULE | ORAL | Status: DC
Start: 2017-09-15 — End: 2017-09-16

## 2017-09-16 MED ORDER — MIRTAZAPINE 15 MG PO TBDP
15.00 | ORAL_TABLET | ORAL | Status: DC
Start: ? — End: 2017-09-16

## 2017-09-16 MED ORDER — GENERIC EXTERNAL MEDICATION
1.00 | Status: DC
Start: 2017-09-16 — End: 2017-09-16

## 2017-09-16 MED ORDER — MEMANTINE HCL 5 MG PO TABS
5.00 | ORAL_TABLET | ORAL | Status: DC
Start: 2017-09-16 — End: 2017-09-16

## 2017-09-16 MED ORDER — PERPHENAZINE 2 MG PO TABS
2.00 | ORAL_TABLET | ORAL | Status: DC
Start: 2017-09-15 — End: 2017-09-16

## 2017-09-16 MED ORDER — RIVASTIGMINE 4.6 MG/24HR TD PT24
1.00 | MEDICATED_PATCH | TRANSDERMAL | Status: DC
Start: 2017-09-16 — End: 2017-09-16

## 2017-09-16 MED ORDER — ALPRAZOLAM 0.5 MG PO TABS
0.25 | ORAL_TABLET | ORAL | Status: DC
Start: ? — End: 2017-09-16

## 2017-09-16 MED ORDER — GABAPENTIN 100 MG PO CAPS
100.00 | ORAL_CAPSULE | ORAL | Status: DC
Start: 2017-09-15 — End: 2017-09-16

## 2017-09-17 ENCOUNTER — Ambulatory Visit (INDEPENDENT_AMBULATORY_CARE_PROVIDER_SITE_OTHER): Payer: Medicare HMO | Admitting: Internal Medicine

## 2017-09-17 ENCOUNTER — Encounter: Payer: Self-pay | Admitting: Internal Medicine

## 2017-09-17 VITALS — BP 126/62 | HR 96 | Temp 97.6°F | Resp 14 | Ht 59.84 in | Wt 96.0 lb

## 2017-09-17 DIAGNOSIS — F0391 Unspecified dementia with behavioral disturbance: Secondary | ICD-10-CM | POA: Diagnosis not present

## 2017-09-17 DIAGNOSIS — R69 Illness, unspecified: Secondary | ICD-10-CM | POA: Diagnosis not present

## 2017-09-17 DIAGNOSIS — F22 Delusional disorders: Secondary | ICD-10-CM

## 2017-09-17 LAB — TSH: TSH: 1.34 u[IU]/mL (ref 0.35–4.50)

## 2017-09-17 LAB — B12 AND FOLATE PANEL
Folate: >23.6 ng/mL (ref >5.9)
VITAMIN B 12: 423 pg/mL (ref 211–911)

## 2017-09-17 NOTE — Patient Instructions (Addendum)
Please see your primary doctor in 2 weeks  Out of this medication: Remeron, Trilafon, EnLyte, Namenda, Exelon is ok to start Namenda for now   She needs to see a psychiatrist, Dr. Casimiro Needle would be a good resource.  We will refer you to a home health agency  [facility] is a great idea  Finished antibiotic

## 2017-09-17 NOTE — Progress Notes (Signed)
Pre visit review using our clinic review tool, if applicable. No additional management support is needed unless otherwise documented below in the visit note. 

## 2017-09-17 NOTE — Progress Notes (Signed)
Subjective:    Patient ID: Peggy Peterson, female    DOB: 1927/01/20, 84 y.o.   MRN: [id number]  DOS:  09/17/2017 Type of visit - description :  Interval history:  Here w/ Peggy Peterson her daughter, she is a CMA, lives in [place], plans to stay with her mother until she is better. Prior to the admission, the patient and her husband Peggy Peterson were having some personal issues. In fact last week 911 was called apparently due to their personal issues .  On Monday, 09/14/2017, the patient and her husband went to the hospital, he was admitted apparently due to a UTI, the staff at the hospital noted her to be demented, delusional, she made some homicidal threats to the husband and consequently was admitted to the hospital under the care of psychiatry. She was diagnosed with a UTI and prescribed Keflex.  She was released the next day, she is back home with her daughter Peggy Peterson. She is now doing better.  She is alert and oriented x3 according to the daughter, does not wander, has not make any homicidal or suicidal comments. They were recommended to remove a gun and they have done that. No fever chills or headaches No nausea, vomiting or diarrhea. No dysuria. I asked the patient how she feels, "I just like things to go back to normal at home with Peggy Peterson". She reports no physical symptoms or problems.  Hospital records are extensive, reviewed to the best of my ability. UDS show benzodiazepines, urinalysis showed leukocytes, urine culture showed 10-15 K Corynebacterium. CMP normal except for a sugar of 184 CBC showed a platelet of 139  Review of Systems As above  Past Medical History:  Diagnosis Date  . Anxiety   . H/O measles   . H/O mumps   . History of chicken pox   . Hyperlipidemia   . Hypertension   . Leukopenia 04/08/2015  . Paroxysmal atrial fibrillation ([place]) 04/05/2015  . Preventative health care 06/09/2016    Past Surgical History:  Procedure Laterality Date  . ABDOMINAL HYSTERECTOMY        Social History   Socioeconomic History  . Marital status: Married    Spouse name: Not on file  . Number of children: Not on file  . Years of education: Not on file  . Highest education level: Not on file  Occupational History  . Occupation: retired   Scientific laboratory technician  . Financial resource strain: Not on file  . Food insecurity:    Worry: Not on file    Inability: Not on file  . Transportation needs:    Medical: Not on file    Non-medical: Not on file  Tobacco Use  . Smoking status: Former Research scientist (life sciences)  . Smokeless tobacco: Never Used  . Tobacco comment: Stopper smoking in 1960  Substance and Sexual Activity  . Alcohol use: No    Alcohol/week: 0.0 oz  . Drug use: No  . Sexual activity: Never    Comment: lives with husband, no dietary restrictions.  Lifestyle  . Physical activity:    Days per week: Not on file    Minutes per session: Not on file  . Stress: Not on file  Relationships  . Social connections:    Talks on phone: Not on file    Gets together: Not on file    Attends religious service: Not on file    Active member of club or organization: Not on file    Attends meetings of clubs or organizations: Not  on file    Relationship status: Not on file  . Intimate partner violence:    Fear of current or ex partner: Not on file    Emotionally abused: Not on file    Physically abused: Not on file    Forced sexual activity: Not on file  Other Topics Concern  . Not on file  Social History Narrative   Married cares for husband at home. No dietary restrictions      Allergies as of 09/17/2017      Reactions   Fosamax [alendronate Sodium] Other (See Comments)   Abdominal pain      Medication List        Accurate as of 09/17/17 11:59 PM. Always use your most recent med list.          ALPRAZolam 1 MG tablet Commonly known as:  XANAX Take 0.5-1 tablets (0.5-1 mg total) by mouth 2 (two) times daily as needed. for anxiety   CALCIUM CITRATE + PO Take 2 tablets by  mouth daily.   cephALEXin 500 MG capsule Commonly known as:  KEFLEX Take 1 capsule by mouth 4 (four) times daily. For 5 days   docusate sodium 100 MG capsule Commonly known as:  COLACE Take 100 mg by mouth daily as needed for mild constipation.   ENLYTE Caps Take 1 tablet by mouth daily. For 30 days   gabapentin 100 MG capsule Commonly known as:  NEURONTIN TAKE 1 CAPSULE (100 MG TOTAL) BY MOUTH AT BEDTIME.   lisinopril 10 MG tablet Commonly known as:  PRINIVIL,ZESTRIL Take 1 tablet (10 mg total) by mouth 2 (two) times daily.   memantine 5 MG tablet Commonly known as:  NAMENDA Take 1 tablet by mouth daily.   metoprolol succinate 50 MG 24 hr tablet Commonly known as:  TOPROL-XL Take 1 tablet (50 mg total) daily by mouth. Take with or immediately following a meal.   mirtazapine 15 MG tablet Commonly known as:  REMERON Take 1 tablet by mouth daily.   perphenazine 2 MG tablet Commonly known as:  TRILAFON Take 1 tablet by mouth 3 (three) times daily.   rivastigmine 4.6 mg/24hr Commonly known as:  EXELON Place 1 patch onto the skin daily.   sertraline 50 MG tablet Commonly known as:  ZOLOFT TAKE 1 TABLET (50 MG TOTAL) DAILY          Objective:   Physical Exam BP 126/62 (BP Location: Left Arm, Patient Position: Sitting, Cuff Size: Small)   Pulse 96   Temp 97.6 F (36.4 C) (Oral)   Resp 14   Ht 4' 11.84" (1.52 m)   Wt 96 lb (43.5 kg)   SpO2 96%   BMI 18.85 kg/m      Assessment & Plan:   84 year old lady with history of anxiety, hyperlipidemia, hypertension, paroxysmal A. fib, resents for a hospital follow-up:  Dementia   delusions, recent homicidal ideas. Patient with a background decreased memory and now diagnosed with dementia  was admitted to the hospital recently with increased symptoms as described above. Her husband is also admitted but he remains in the hospital. Patient is currently at home with the company of her daughter Peggy Peterson, she lives in  [place] but plans to stay here until  Parents are set up with the help they need. She was discharged home on several new meds including Remeron, Trilafon, EnLyte, Namenda, Exelon. Peggy Peterson has not restarted any of these medications just yet.  Wonders if she needs to take all the medication,  I agree.   The social worker has not been able to find a reasonable nursing home for the patient, Peggy Peterson has an appointment with [facility] a service that she hopes will provide care either 24/ 7 or 12 hours daily.  She also requests home health care referral which we will do. Plan: Refer to a home health agency Agreed to get more help from [facility] Like the patient's daughter I am concerned about a number of medications offered to her.  She is okay starting Namenda and I agree although I'll leave the other meds on her list. Refer to Dr. Casimiro Needle, number provided, he is a psychiatrist Return to clinic in 2 weeks to see PCP Cindy's number: [phone]  Today, I spent more than 42  min with the patient and her daughter >50% of the time counseling regards pro-cons of meds Rx to her, coordinating her care, counseling Cindy regrads dementia and listening to all her concerns  Also: -reviewing the chart and labs ordered by other providers  -coordinating his care

## 2017-09-18 ENCOUNTER — Other Ambulatory Visit: Payer: Self-pay

## 2017-09-18 DIAGNOSIS — F22 Delusional disorders: Secondary | ICD-10-CM | POA: Insufficient documentation

## 2017-09-18 MED ORDER — SERTRALINE HCL 50 MG PO TABS: ORAL_TABLET | ORAL | 0 refills | Status: DC

## 2017-10-12 ENCOUNTER — Encounter: Payer: Self-pay | Admitting: Family Medicine

## 2017-10-12 ENCOUNTER — Ambulatory Visit (INDEPENDENT_AMBULATORY_CARE_PROVIDER_SITE_OTHER): Payer: Medicare HMO | Admitting: Family Medicine

## 2017-10-12 VITALS — BP 122/52 | HR 66 | Temp 97.7°F | Resp 18 | Wt 95.4 lb

## 2017-10-12 DIAGNOSIS — I10 Essential (primary) hypertension: Secondary | ICD-10-CM

## 2017-10-12 DIAGNOSIS — F0391 Unspecified dementia with behavioral disturbance: Secondary | ICD-10-CM

## 2017-10-12 DIAGNOSIS — N390 Urinary tract infection, site not specified: Secondary | ICD-10-CM | POA: Diagnosis not present

## 2017-10-12 DIAGNOSIS — R69 Illness, unspecified: Secondary | ICD-10-CM | POA: Diagnosis not present

## 2017-10-12 LAB — URINALYSIS, ROUTINE W REFLEX MICROSCOPIC
BILIRUBIN URINE: NEGATIVE
KETONES UR: NEGATIVE
Leukocytes, UA: NEGATIVE
NITRITE: NEGATIVE
Specific Gravity, Urine: 1.025 (ref 1.000–1.030)
TOTAL PROTEIN, URINE-UPE24: NEGATIVE
Urine Glucose: NEGATIVE
Urobilinogen, UA: 0.2 (ref 0.0–1.0)
pH: 6 (ref 5.0–8.0)

## 2017-10-12 MED ORDER — MEMANTINE HCL 5 MG PO TABS: 5.0000 mg | ORAL_TABLET | Freq: Every day | ORAL | 1 refills | Status: DC

## 2017-10-12 NOTE — Patient Instructions (Signed)

## 2017-10-12 NOTE — Assessment & Plan Note (Signed)
Recent UTI, asymptomati but will test for resolution.

## 2017-10-12 NOTE — Progress Notes (Signed)
Subjective:  I acted as a Education administrator for Dr. Charlett Blake. [place], [place]  Patient ID: Peggy Peterson, female    DOB: [birth year], 84 y.o.   MRN: [id number]  No chief complaint on file.   HPI  Patient is in today for 3 week follow up and she is accompanied by her daughter. She is doing better since they were able to arrange for [facility] to come out and help her and her husband with activities and therapy. No urinary symptoms or febrile illness. Denies CP/palp/SOB/HA/congestion/fevers/GI or GU c/o. Taking meds as prescribed. Still struggles with anxiety and memory loss but is managing better.   Patient Care Team: Mosie Lukes, MD as PCP - General (Family Medicine)   Past Medical History:  Diagnosis Date  . Anxiety   . H/O measles   . H/O mumps   . History of chicken pox   . Hyperlipidemia   . Hypertension   . Leukopenia 04/08/2015  . Paroxysmal atrial fibrillation ([place]) 04/05/2015  . Preventative health care 06/09/2016    Past Surgical History:  Procedure Laterality Date  . ABDOMINAL HYSTERECTOMY      Family History  Problem Relation Age of Onset  . Cancer Mother 28       leukemia   . Diabetes Father   . Cancer Father   . Arthritis Sister   . Dementia Brother   . Hip fracture Brother   . Diabetes Paternal Uncle     Social History   Socioeconomic History  . Marital status: Married    Spouse name: Not on file  . Number of children: Not on file  . Years of education: Not on file  . Highest education level: Not on file  Occupational History  . Occupation: retired   Scientific laboratory technician  . Financial resource strain: Not on file  . Food insecurity:    Worry: Not on file    Inability: Not on file  . Transportation needs:    Medical: Not on file    Non-medical: Not on file  Tobacco Use  . Smoking status: Former Research scientist (life sciences)  . Smokeless tobacco: Never Used  . Tobacco comment: Stopper smoking in 1960  Substance and Sexual Activity  . Alcohol use: No    Alcohol/week: 0.0 oz   . Drug use: No  . Sexual activity: Never    Comment: lives with husband, no dietary restrictions.  Lifestyle  . Physical activity:    Days per week: Not on file    Minutes per session: Not on file  . Stress: Not on file  Relationships  . Social connections:    Talks on phone: Not on file    Gets together: Not on file    Attends religious service: Not on file    Active member of club or organization: Not on file    Attends meetings of clubs or organizations: Not on file    Relationship status: Not on file  . Intimate partner violence:    Fear of current or ex partner: Not on file    Emotionally abused: Not on file    Physically abused: Not on file    Forced sexual activity: Not on file  Other Topics Concern  . Not on file  Social History Narrative   Married cares for husband at home. No dietary restrictions    Outpatient Medications Prior to Visit  Medication Sig Dispense Refill  . ALPRAZolam (XANAX) 1 MG tablet Take 0.5-1 tablets (0.5-1 mg total) by  mouth 2 (two) times daily as needed. for anxiety 30 tablet 1  . Calcium Citrate-Vitamin D (CALCIUM CITRATE + PO) Take 2 tablets by mouth daily.    . Dietary Management Product (ENLYTE) CAPS Take 1 tablet by mouth daily. For 30 days    . docusate sodium (COLACE) 100 MG capsule Take 100 mg by mouth daily as needed for mild constipation.    . gabapentin (NEURONTIN) 100 MG capsule TAKE 1 CAPSULE (100 MG TOTAL) BY MOUTH AT BEDTIME. 30 capsule 5  . lisinopril (PRINIVIL,ZESTRIL) 10 MG tablet Take 1 tablet (10 mg total) by mouth 2 (two) times daily. 180 tablet 1  . metoprolol succinate (TOPROL-XL) 50 MG 24 hr tablet Take 1 tablet (50 mg total) daily by mouth. Take with or immediately following a meal. 90 tablet 1  . perphenazine (TRILAFON) 2 MG tablet Take 1 tablet by mouth 3 (three) times daily.    . sertraline (ZOLOFT) 50 MG tablet TAKE 1 TABLET (50 MG TOTAL) DAILY 90 tablet 0  . memantine (NAMENDA) 5 MG tablet Take 1 tablet by mouth  daily.    . mirtazapine (REMERON) 15 MG tablet Take 1 tablet by mouth daily.    . rivastigmine (EXELON) 4.6 mg/24hr Place 1 patch onto the skin daily.     No facility-administered medications prior to visit.     Allergies  Allergen Reactions  . Fosamax [Alendronate Sodium] Other (See Comments)    Abdominal pain    Review of Systems  Constitutional: Negative for fever and malaise/fatigue.  HENT: Negative for congestion.   Eyes: Negative for blurred vision.  Respiratory: Negative for shortness of breath.   Cardiovascular: Negative for chest pain, palpitations and leg swelling.  Gastrointestinal: Negative for abdominal pain, blood in stool and nausea.  Genitourinary: Negative for dysuria and frequency.  Musculoskeletal: Negative for falls.  Skin: Negative for rash.  Neurological: Negative for dizziness, loss of consciousness and headaches.  Endo/Heme/Allergies: Negative for environmental allergies.  Psychiatric/Behavioral: Positive for memory loss. Negative for depression. The patient is nervous/anxious.        Objective:    Physical Exam  Constitutional: She is oriented to person, place, and time. She appears well-developed and well-nourished. No distress.  HENT:  Head: Normocephalic and atraumatic.  Nose: Nose normal.  Eyes: Right eye exhibits no discharge. Left eye exhibits no discharge.  Neck: Normal range of motion. Neck supple.  Cardiovascular: Normal rate and regular rhythm.  No murmur heard. Pulmonary/Chest: Effort normal and breath sounds normal.  Abdominal: Soft. Bowel sounds are normal. There is no tenderness.  Musculoskeletal: She exhibits no edema.  Neurological: She is alert and oriented to person, place, and time.  Skin: Skin is warm and dry.  Psychiatric: She has a normal mood and affect.  Nursing note and vitals reviewed.   BP (!) 122/52 (BP Location: Left Arm, Patient Position: Sitting, Cuff Size: Normal)   Pulse 66   Temp 97.7 F (36.5 C) (Oral)    Resp 18   Wt 95 lb 6.4 oz (43.3 kg)   SpO2 97%   BMI 18.73 kg/m  Wt Readings from Last 3 Encounters:  10/12/17 95 lb 6.4 oz (43.3 kg)  09/17/17 96 lb (43.5 kg)  08/10/17 95 lb (43.1 kg)   BP Readings from Last 3 Encounters:  10/12/17 (!) 122/52  09/17/17 126/62  08/10/17 126/75     Immunization History  Administered Date(s) Administered  . Influenza, High Dose Seasonal PF 04/05/2015  . Influenza-Unspecified 05/02/2016, 04/13/2017  . Pneumococcal  Conjugate-13 07/10/2015  . Pneumococcal Polysaccharide-23 02/02/2017    Health Maintenance  Topic Date Due  . Samul Dada  10/15/1945  . INFLUENZA VACCINE  01/28/2018  . DEXA SCAN  Completed  . PNA vac Low Risk Adult  Completed    Lab Results  Component Value Date   WBC 4.4 05/25/2017   HGB 14.3 05/25/2017   HCT 44.5 05/25/2017   PLT 172.0 05/25/2017   GLUCOSE 99 08/10/2017   CHOL 239 (H) 05/25/2017   TRIG 100.0 05/25/2017   HDL 65.30 05/25/2017   LDLCALC 154 (H) 05/25/2017   ALT 15 08/10/2017   AST 20 08/10/2017   NA 141 08/10/2017   K 5.3 (H) 08/10/2017   CL 103 08/10/2017   CREATININE 0.88 08/10/2017   BUN 20 08/10/2017   CO2 32 08/10/2017   TSH 1.34 09/17/2017    Lab Results  Component Value Date   TSH 1.34 09/17/2017   Lab Results  Component Value Date   WBC 4.4 05/25/2017   HGB 14.3 05/25/2017   HCT 44.5 05/25/2017   MCV 92.8 05/25/2017   PLT 172.0 05/25/2017   Lab Results  Component Value Date   NA 141 08/10/2017   K 5.3 (H) 08/10/2017   CO2 32 08/10/2017   GLUCOSE 99 08/10/2017   BUN 20 08/10/2017   CREATININE 0.88 08/10/2017   BILITOT 0.5 08/10/2017   ALKPHOS 79 08/10/2017   AST 20 08/10/2017   ALT 15 08/10/2017   PROT 6.9 08/10/2017   ALBUMIN 4.1 08/10/2017   CALCIUM 9.6 08/10/2017   GFR 64.33 08/10/2017   Lab Results  Component Value Date   CHOL 239 (H) 05/25/2017   Lab Results  Component Value Date   HDL 65.30 05/25/2017   Lab Results  Component Value Date   LDLCALC 154  (H) 05/25/2017   Lab Results  Component Value Date   TRIG 100.0 05/25/2017   Lab Results  Component Value Date   CHOLHDL 4 05/25/2017   No results found for: HGBA1C       Assessment & Plan:   Problem List Items Addressed This Visit    Hypertension    Well controlled, no changes to meds. Encouraged heart healthy diet such as the DASH diet and exercise as tolerated.       Dementia    Is tolerating Namenda 5 mg without side effects and will continue at this dose for now. Refill given. Doing much better with [place] assistance in home now also. Orders signed      Relevant Medications   memantine (NAMENDA) 5 MG tablet   Urinary tract infection without hematuria - Primary    Recent UTI, asymptomati but will test for resolution.      Relevant Orders   Urinalysis   Urine Culture (Completed)      I have discontinued Marva Panda. [place]'s mirtazapine and rivastigmine. I have also changed her memantine. Additionally, I am having her maintain her Calcium Citrate-Vitamin D (CALCIUM CITRATE + PO), docusate sodium, lisinopril, metoprolol succinate, gabapentin, ALPRAZolam, and sertraline.  Meds ordered this encounter  Medications  . memantine (NAMENDA) 5 MG tablet    Sig: Take 1 tablet (5 mg total) by mouth daily.    Dispense:  90 tablet    Refill:  1    CMA served as Education administrator during this visit. History, Physical and Plan performed by medical provider. Documentation and orders reviewed and attested to.  Penni Homans, MD

## 2017-10-12 NOTE — Assessment & Plan Note (Signed)
Well controlled, no changes to meds. Encouraged heart healthy diet such as the DASH diet and exercise as tolerated.  °

## 2017-10-12 NOTE — Assessment & Plan Note (Addendum)
Is tolerating Namenda 5 mg without side effects and will continue at this dose for now. Refill given. Doing much better with [place] assistance in home now also. Orders signed

## 2017-10-13 LAB — URINE CULTURE
MICRO NUMBER:: 90460543
RESULT: NO GROWTH
SPECIMEN QUALITY: ADEQUATE

## 2017-10-28 ENCOUNTER — Other Ambulatory Visit: Payer: Self-pay | Admitting: Family Medicine

## 2017-10-30 NOTE — Telephone Encounter (Signed)
Refill sent, notified pt. 

## 2017-10-30 NOTE — Telephone Encounter (Signed)
[facility] is calling and wanting to know if this can be refilled before the office closes today, please advise.

## 2017-11-02 ENCOUNTER — Ambulatory Visit (INDEPENDENT_AMBULATORY_CARE_PROVIDER_SITE_OTHER): Payer: Medicare HMO | Admitting: Family Medicine

## 2017-11-02 ENCOUNTER — Encounter: Payer: Self-pay | Admitting: Family Medicine

## 2017-11-02 VITALS — BP 137/66 | HR 71 | Temp 97.8°F | Resp 18 | Wt 94.2 lb

## 2017-11-02 DIAGNOSIS — I10 Essential (primary) hypertension: Secondary | ICD-10-CM | POA: Diagnosis not present

## 2017-11-02 DIAGNOSIS — F0391 Unspecified dementia with behavioral disturbance: Secondary | ICD-10-CM

## 2017-11-02 DIAGNOSIS — R69 Illness, unspecified: Secondary | ICD-10-CM | POA: Diagnosis not present

## 2017-11-02 DIAGNOSIS — E7849 Other hyperlipidemia: Secondary | ICD-10-CM

## 2017-11-02 DIAGNOSIS — E875 Hyperkalemia: Secondary | ICD-10-CM

## 2017-11-02 LAB — LIPID PANEL
Cholesterol: 224 mg/dL — ABNORMAL HIGH (ref 0–200)
HDL: 64.3 mg/dL (ref >39.00)
LDL Cholesterol: 140 mg/dL — ABNORMAL HIGH (ref 0–99)
NONHDL: 159.65
Total CHOL/HDL Ratio: 3
Triglycerides: 100 mg/dL (ref 0.0–149.0)
VLDL: 20 mg/dL (ref 0.0–40.0)

## 2017-11-02 LAB — COMPREHENSIVE METABOLIC PANEL
ALK PHOS: 71 U/L (ref 39–117)
ALT: 8 U/L (ref 0–35)
AST: 18 U/L (ref 0–37)
Albumin: 4.1 g/dL (ref 3.5–5.2)
BILIRUBIN TOTAL: 0.6 mg/dL (ref 0.2–1.2)
BUN: 21 mg/dL (ref 6–23)
CO2: 32 mEq/L (ref 19–32)
CREATININE: 0.91 mg/dL (ref 0.40–1.20)
Calcium: 10 mg/dL (ref 8.4–10.5)
Chloride: 102 mEq/L (ref 96–112)
GFR: 61.86 mL/min (ref >60.00)
GLUCOSE: 83 mg/dL (ref 70–99)
Potassium: 4.7 mEq/L (ref 3.5–5.1)
SODIUM: 141 meq/L (ref 135–145)
TOTAL PROTEIN: 7.1 g/dL (ref 6.0–8.3)

## 2017-11-02 LAB — CBC WITH DIFFERENTIAL/PLATELET
BASOS ABS: 0 10*3/uL (ref 0.0–0.1)
Basophils Relative: 0.5 % (ref 0.0–3.0)
Eosinophils Absolute: 0 10*3/uL (ref 0.0–0.7)
Eosinophils Relative: 1.3 % (ref 0.0–5.0)
HCT: 41.6 % (ref 36.0–46.0)
Hemoglobin: 13.8 g/dL (ref 12.0–15.0)
LYMPHS ABS: 1.2 10*3/uL (ref 0.7–4.0)
Lymphocytes Relative: 36.4 % (ref 12.0–46.0)
MCHC: 33.1 g/dL (ref 30.0–36.0)
MCV: 90.9 fl (ref 78.0–100.0)
MONOS PCT: 11 % (ref 3.0–12.0)
Monocytes Absolute: 0.4 10*3/uL (ref 0.1–1.0)
NEUTROS PCT: 50.8 % (ref 43.0–77.0)
Neutro Abs: 1.7 10*3/uL (ref 1.4–7.7)
Platelets: 142 10*3/uL — ABNORMAL LOW (ref 150.0–400.0)
RBC: 4.57 Mil/uL (ref 3.87–5.11)
RDW: 14.5 % (ref 11.5–15.5)
WBC: 3.3 10*3/uL — ABNORMAL LOW (ref 4.0–10.5)

## 2017-11-02 LAB — TSH: TSH: 2.15 u[IU]/mL (ref 0.35–4.50)

## 2017-11-02 NOTE — Assessment & Plan Note (Signed)
Well controlled, no changes to meds. Encouraged heart healthy diet such as the DASH diet and exercise as tolerated.  °

## 2017-11-02 NOTE — Assessment & Plan Note (Signed)
Encouraged heart healthy diet, increase exercise, avoid trans fats, consider a krill oil cap daily 

## 2017-11-02 NOTE — Progress Notes (Signed)
Subjective:  I acted as a Education administrator for Dr. Charlett Blake. [place], [place]  Patient ID: Peggy Peterson, female    DOB: [birth year], 84 y.o.   MRN: [id number]  No chief complaint on file.   HPI  Patient is in today for a 3 month follow up and she is accompanied by her daughter and her husband. They report she is doing better at home with some home health aides and family support. They chose not to pick up the Exelon due to costs. No recent febrile illness or hospitalizations. Denies CP/palp/SOB/HA/congestion/fevers/GI or GU c/o. Taking meds as prescribed  Patient Care Team: Mosie Lukes, MD as PCP - General (Family Medicine)   Past Medical History:  Diagnosis Date  . Anxiety   . H/O measles   . H/O mumps   . History of chicken pox   . Hyperlipidemia   . Hypertension   . Leukopenia 04/08/2015  . Paroxysmal atrial fibrillation ([place]) 04/05/2015  . Preventative health care 06/09/2016    Past Surgical History:  Procedure Laterality Date  . ABDOMINAL HYSTERECTOMY      Family History  Problem Relation Age of Onset  . Cancer Mother 37       leukemia   . Diabetes Father   . Cancer Father   . Arthritis Sister   . Dementia Brother   . Hip fracture Brother   . Diabetes Paternal Uncle     Social History   Socioeconomic History  . Marital status: Married    Spouse name: Not on file  . Number of children: Not on file  . Years of education: Not on file  . Highest education level: Not on file  Occupational History  . Occupation: retired   Scientific laboratory technician  . Financial resource strain: Not on file  . Food insecurity:    Worry: Not on file    Inability: Not on file  . Transportation needs:    Medical: Not on file    Non-medical: Not on file  Tobacco Use  . Smoking status: Former Research scientist (life sciences)  . Smokeless tobacco: Never Used  . Tobacco comment: Stopper smoking in 1960  Substance and Sexual Activity  . Alcohol use: No    Alcohol/week: 0.0 oz  . Drug use: No  . Sexual activity: Never   Comment: lives with husband, no dietary restrictions.  Lifestyle  . Physical activity:    Days per week: Not on file    Minutes per session: Not on file  . Stress: Not on file  Relationships  . Social connections:    Talks on phone: Not on file    Gets together: Not on file    Attends religious service: Not on file    Active member of club or organization: Not on file    Attends meetings of clubs or organizations: Not on file    Relationship status: Not on file  . Intimate partner violence:    Fear of current or ex partner: Not on file    Emotionally abused: Not on file    Physically abused: Not on file    Forced sexual activity: Not on file  Other Topics Concern  . Not on file  Social History Narrative   Married cares for husband at home. No dietary restrictions    Outpatient Medications Prior to Visit  Medication Sig Dispense Refill  . ALPRAZolam (XANAX) 1 MG tablet Take 0.5-1 tablets (0.5-1 mg total) by mouth 2 (two) times daily as needed. for anxiety 30 tablet  1  . Calcium Citrate-Vitamin D (CALCIUM CITRATE + PO) Take 2 tablets by mouth daily.    Marland Kitchen docusate sodium (COLACE) 100 MG capsule Take 100 mg by mouth daily as needed for mild constipation.    . gabapentin (NEURONTIN) 100 MG capsule TAKE 1 CAPSULE (100 MG TOTAL) BY MOUTH AT BEDTIME. 30 capsule 5  . lisinopril (PRINIVIL,ZESTRIL) 10 MG tablet TAKE 1 TABLET BY MOUTH TWICE A DAY 180 tablet 1  . memantine (NAMENDA) 5 MG tablet Take 1 tablet (5 mg total) by mouth daily. 90 tablet 1  . metoprolol succinate (TOPROL-XL) 50 MG 24 hr tablet Take 1 tablet (50 mg total) daily by mouth. Take with or immediately following a meal. 90 tablet 1  . sertraline (ZOLOFT) 50 MG tablet TAKE 1 TABLET (50 MG TOTAL) DAILY 90 tablet 0   No facility-administered medications prior to visit.     Allergies  Allergen Reactions  . Fosamax [Alendronate Sodium] Other (See Comments)    Abdominal pain    Review of Systems  Constitutional: Negative  for fever and malaise/fatigue.  HENT: Negative for congestion.   Eyes: Negative for blurred vision.  Respiratory: Negative for shortness of breath.   Cardiovascular: Negative for chest pain, palpitations and leg swelling.  Gastrointestinal: Negative for abdominal pain, blood in stool and nausea.  Genitourinary: Negative for dysuria and frequency.  Musculoskeletal: Negative for falls.  Skin: Negative for rash.  Neurological: Negative for dizziness, loss of consciousness and headaches.  Endo/Heme/Allergies: Negative for environmental allergies.  Psychiatric/Behavioral: Positive for memory loss. Negative for depression. The patient is not nervous/anxious.        Objective:    Physical Exam  Constitutional: She is oriented to person, place, and time. No distress.  HENT:  Head: Normocephalic and atraumatic.  Eyes: Conjunctivae are normal.  Neck: Neck supple. No thyromegaly present.  Cardiovascular: Normal rate, regular rhythm and normal heart sounds.  No murmur heard. Pulmonary/Chest: Effort normal and breath sounds normal. She has no wheezes.  Abdominal: She exhibits no distension and no mass.  Musculoskeletal: She exhibits no edema.  Lymphadenopathy:    She has no cervical adenopathy.  Neurological: She is alert and oriented to person, place, and time.  Skin: Skin is warm and dry. No rash noted. She is not diaphoretic.  Psychiatric: Judgment normal.    BP 137/66 (BP Location: Left Arm, Patient Position: Sitting, Cuff Size: Normal)   Pulse 71   Temp 97.8 F (36.6 C) (Oral)   Resp 18   Wt 94 lb 3.2 oz (42.7 kg)   SpO2 100%   BMI 18.50 kg/m  Wt Readings from Last 3 Encounters:  11/02/17 94 lb 3.2 oz (42.7 kg)  10/12/17 95 lb 6.4 oz (43.3 kg)  09/17/17 96 lb (43.5 kg)   BP Readings from Last 3 Encounters:  11/02/17 137/66  10/12/17 (!) 122/52  09/17/17 126/62     Immunization History  Administered Date(s) Administered  . Influenza, High Dose Seasonal PF 04/05/2015    . Influenza-Unspecified 05/02/2016, 04/13/2017  . Pneumococcal Conjugate-13 07/10/2015  . Pneumococcal Polysaccharide-23 02/02/2017    Health Maintenance  Topic Date Due  . Samul Dada  10/15/1945  . INFLUENZA VACCINE  01/28/2018  . DEXA SCAN  Completed  . PNA vac Low Risk Adult  Completed    Lab Results  Component Value Date   WBC 4.4 05/25/2017   HGB 14.3 05/25/2017   HCT 44.5 05/25/2017   PLT 172.0 05/25/2017   GLUCOSE 99 08/10/2017   CHOL  239 (H) 05/25/2017   TRIG 100.0 05/25/2017   HDL 65.30 05/25/2017   LDLCALC 154 (H) 05/25/2017   ALT 15 08/10/2017   AST 20 08/10/2017   NA 141 08/10/2017   K 5.3 (H) 08/10/2017   CL 103 08/10/2017   CREATININE 0.88 08/10/2017   BUN 20 08/10/2017   CO2 32 08/10/2017   TSH 1.34 09/17/2017    Lab Results  Component Value Date   TSH 1.34 09/17/2017   Lab Results  Component Value Date   WBC 4.4 05/25/2017   HGB 14.3 05/25/2017   HCT 44.5 05/25/2017   MCV 92.8 05/25/2017   PLT 172.0 05/25/2017   Lab Results  Component Value Date   NA 141 08/10/2017   K 5.3 (H) 08/10/2017   CO2 32 08/10/2017   GLUCOSE 99 08/10/2017   BUN 20 08/10/2017   CREATININE 0.88 08/10/2017   BILITOT 0.5 08/10/2017   ALKPHOS 79 08/10/2017   AST 20 08/10/2017   ALT 15 08/10/2017   PROT 6.9 08/10/2017   ALBUMIN 4.1 08/10/2017   CALCIUM 9.6 08/10/2017   GFR 64.33 08/10/2017   Lab Results  Component Value Date   CHOL 239 (H) 05/25/2017   Lab Results  Component Value Date   HDL 65.30 05/25/2017   Lab Results  Component Value Date   LDLCALC 154 (H) 05/25/2017   Lab Results  Component Value Date   TRIG 100.0 05/25/2017   Lab Results  Component Value Date   CHOLHDL 4 05/25/2017   No results found for: HGBA1C       Assessment & Plan:   Problem List Items Addressed This Visit    Hyperlipidemia    Encouraged heart healthy diet, increase exercise, avoid trans fats, consider a krill oil cap daily      Relevant Orders   Lipid  panel   Hypertension - Primary    Well controlled, no changes to meds. Encouraged heart healthy diet such as the DASH diet and exercise as tolerated.       Relevant Orders   CBC with Differential/Platelet   Comprehensive metabolic panel   TSH   Dementia    Tolerating Namenda but they did not pick up the Exelon patch due to cost. She did not tolerate Donepezil. They will not add another med at the time being. Her daughter says they are doing OK at home with in home care and family support.       Other Visit Diagnoses    Hyperkalemia       Relevant Orders   Comprehensive metabolic panel      I am having Mickey P. Paull maintain her Calcium Citrate-Vitamin D (CALCIUM CITRATE + PO), docusate sodium, metoprolol succinate, gabapentin, ALPRAZolam, sertraline, memantine, and lisinopril.  No orders of the defined types were placed in this encounter.   CMA served as Education administrator during this visit. History, Physical and Plan performed by medical provider. Documentation and orders reviewed and attested to.  Penni Homans, MD

## 2017-11-02 NOTE — Assessment & Plan Note (Signed)
Tolerating Namenda but they did not pick up the Exelon patch due to cost. She did not tolerate Donepezil. They will not add another med at the time being. Her daughter says they are doing OK at home with in home care and family support.

## 2017-11-02 NOTE — Patient Instructions (Signed)
Covermymeds Goodrx  websites to help with med costs Dementia Dementia means losing some of your brain ability. People with dementia may have problems with:  Memory.  Making decisions.  Behavior.  Speaking.  Thinking.  Solving problems.  Follow these instructions at home: Medicine  Take over-the-counter and prescription medicines only as told by your doctor.  Avoid taking medicines that can change how you think. These include pain or sleeping medicines. Lifestyle   Make healthy choices: ? Be active as told by your doctor. ? Do not use any tobacco products, such as cigarettes, chewing tobacco, and e-cigarettes. If you need help quitting, ask your doctor. ? Eat a healthy diet. ? When you get stressed, do something to help yourself relax. Your doctor can give you tips. ? Spend time with other people.  Drink enough fluid to keep your pee (urine) clear or pale yellow.  Make sure you get good sleep. Use these tips to help you get a good night's rest: ? Try not to take naps during the day. ? Keep your sleeping area dark and cool. ? In the few hours before you go to bed, try not to do any exercise. ? Try not to have foods and drinks with caffeine in the evening. General instructions  Talk with your doctor to figure out: ? What you need help with. ? What your safety needs are.  If you were given a bracelet that tracks your location, make sure to wear it.  Keep all follow-up visits as told by your doctor. This is important. Contact a doctor if:  You have any new problems.  You have problems with choking or swallowing.  You have any symptoms of a different sickness. Get help right away if:  You have a fever.  You feel mixed up (confused) or more mixed up than before.  You have new sleepiness.  You have sleepiness that gets worse.  You have a hard time staying awake.  You or your family members are worried for your safety. This information is not intended to  replace advice given to you by your health care provider. Make sure you discuss any questions you have with your health care provider. Document Released: 05/29/2008 Document Revised: 11/22/2015 Document Reviewed: 03/14/2015 Elsevier Interactive Patient Education  [employer].

## 2017-11-10 ENCOUNTER — Encounter: Payer: Self-pay | Admitting: Family Medicine

## 2017-11-12 ENCOUNTER — Encounter: Payer: Self-pay | Admitting: Family Medicine

## 2017-11-13 MED ORDER — ALPRAZOLAM 0.25 MG PO TABS: ORAL_TABLET | ORAL | 0 refills | Status: DC

## 2017-11-18 ENCOUNTER — Other Ambulatory Visit: Payer: Self-pay | Admitting: Family Medicine

## 2017-11-20 ENCOUNTER — Other Ambulatory Visit: Payer: Self-pay | Admitting: Family Medicine

## 2017-12-15 ENCOUNTER — Ambulatory Visit: Payer: Medicare HMO | Admitting: Family Medicine

## 2017-12-17 ENCOUNTER — Other Ambulatory Visit: Payer: Self-pay | Admitting: Family Medicine

## 2017-12-23 ENCOUNTER — Other Ambulatory Visit: Payer: Self-pay | Admitting: Family Medicine

## 2017-12-25 NOTE — Telephone Encounter (Signed)
I refilled but let her know she needs to come in to update UDS and contract before next refill

## 2017-12-25 NOTE — Telephone Encounter (Signed)
Refill request: Alprazolam  Last RX: 11/13/17  Last OV: 11/02/17 Next OV: 03/08/18 UDS:12/26/16 CSC:12/26/16

## 2018-01-20 ENCOUNTER — Encounter: Payer: Self-pay | Admitting: Family Medicine

## 2018-01-20 ENCOUNTER — Other Ambulatory Visit: Payer: Self-pay | Admitting: Family Medicine

## 2018-01-20 DIAGNOSIS — F419 Anxiety disorder, unspecified: Secondary | ICD-10-CM

## 2018-01-20 NOTE — Telephone Encounter (Signed)
Requesting: xanax 0.25 (1-2 tabs) qday prn Contract: 2018 UDS: 12/26/16 Last OV: 11/02/17 Next Ov: 03/08/18 Last refill: 12/25/17, #120, 0RF Database: no discrepancies found  Please advise. Marland Kitchen

## 2018-01-21 ENCOUNTER — Other Ambulatory Visit: Payer: Self-pay | Admitting: Family Medicine

## 2018-01-21 DIAGNOSIS — F419 Anxiety disorder, unspecified: Secondary | ICD-10-CM

## 2018-01-21 MED ORDER — ALPRAZOLAM 0.25 MG PO TABS: ORAL_TABLET | ORAL | 0 refills | Status: DC

## 2018-01-21 NOTE — Telephone Encounter (Signed)
2 weeks sent in-----to cover in pcp absence or she can wait until Monday for whole rx

## 2018-01-21 NOTE — Telephone Encounter (Signed)
Pt's daughter requesting refill on alprazolam for Pt. Blyth Pt.   Last OV: 11/02/2017  Last Fill: 12/25/2017 #120 and 0rf  (Pt sig: 1-2 tabs bid prn) UDS: 12/26/2016 Low risk CSC: 12/26/2016

## 2018-01-27 ENCOUNTER — Other Ambulatory Visit: Payer: Self-pay | Admitting: Family Medicine

## 2018-02-17 ENCOUNTER — Encounter: Payer: Self-pay | Admitting: Family Medicine

## 2018-02-18 ENCOUNTER — Other Ambulatory Visit: Payer: Self-pay | Admitting: Family Medicine

## 2018-02-18 DIAGNOSIS — F419 Anxiety disorder, unspecified: Secondary | ICD-10-CM

## 2018-02-18 MED ORDER — ALPRAZOLAM 0.25 MG PO TABS: ORAL_TABLET | ORAL | 1 refills | Status: DC

## 2018-02-22 DIAGNOSIS — M810 Age-related osteoporosis without current pathological fracture: Secondary | ICD-10-CM | POA: Diagnosis not present

## 2018-02-22 DIAGNOSIS — I10 Essential (primary) hypertension: Secondary | ICD-10-CM | POA: Diagnosis not present

## 2018-02-22 DIAGNOSIS — K30 Functional dyspepsia: Secondary | ICD-10-CM | POA: Diagnosis not present

## 2018-02-22 DIAGNOSIS — R69 Illness, unspecified: Secondary | ICD-10-CM | POA: Diagnosis not present

## 2018-02-22 DIAGNOSIS — F419 Anxiety disorder, unspecified: Secondary | ICD-10-CM | POA: Diagnosis not present

## 2018-02-22 DIAGNOSIS — G629 Polyneuropathy, unspecified: Secondary | ICD-10-CM | POA: Diagnosis not present

## 2018-02-22 DIAGNOSIS — G4700 Insomnia, unspecified: Secondary | ICD-10-CM | POA: Diagnosis not present

## 2018-02-22 DIAGNOSIS — Z791 Long term (current) use of non-steroidal anti-inflammatories (NSAID): Secondary | ICD-10-CM | POA: Diagnosis not present

## 2018-02-22 DIAGNOSIS — Z8249 Family history of ischemic heart disease and other diseases of the circulatory system: Secondary | ICD-10-CM | POA: Diagnosis not present

## 2018-02-22 DIAGNOSIS — K219 Gastro-esophageal reflux disease without esophagitis: Secondary | ICD-10-CM | POA: Diagnosis not present

## 2018-03-08 ENCOUNTER — Encounter: Payer: Self-pay | Admitting: Family Medicine

## 2018-03-08 ENCOUNTER — Ambulatory Visit (INDEPENDENT_AMBULATORY_CARE_PROVIDER_SITE_OTHER): Payer: Medicare HMO | Admitting: Family Medicine

## 2018-03-08 VITALS — BP 120/63 | HR 64 | Temp 98.4°F | Resp 18 | Ht 60.0 in | Wt 94.0 lb

## 2018-03-08 DIAGNOSIS — F419 Anxiety disorder, unspecified: Secondary | ICD-10-CM | POA: Diagnosis not present

## 2018-03-08 DIAGNOSIS — Z23 Encounter for immunization: Secondary | ICD-10-CM | POA: Diagnosis not present

## 2018-03-08 DIAGNOSIS — E782 Mixed hyperlipidemia: Secondary | ICD-10-CM | POA: Diagnosis not present

## 2018-03-08 DIAGNOSIS — R69 Illness, unspecified: Secondary | ICD-10-CM | POA: Diagnosis not present

## 2018-03-08 DIAGNOSIS — I10 Essential (primary) hypertension: Secondary | ICD-10-CM

## 2018-03-08 NOTE — Assessment & Plan Note (Signed)
Encouraged heart healthy diet, increase exercise, avoid trans fats, consider a krill oil cap daily 

## 2018-03-08 NOTE — Progress Notes (Signed)
Subjective:    Patient ID: Peggy Peterson, female    DOB: 1927/01/15, 84 y.o.   MRN: [id number]  No chief complaint on file.   HPI Patient is in today for follow up accompanied by her husband and her daughters. She is doing well. Eating and sleeping well. Stays active. Anxiety and dementia doing well. No recent febrile illness or acute hospitalizations. Denies CP/palp/SOB/HA/congestion/fevers/GI or GU c/o. Taking meds as prescribed  Past Medical History:  Diagnosis Date  . Anxiety   . H/O measles   . H/O mumps   . History of chicken pox   . Hyperlipidemia   . Hypertension   . Leukopenia 04/08/2015  . Paroxysmal atrial fibrillation ([place]) 04/05/2015  . Preventative health care 06/09/2016    Past Surgical History:  Procedure Laterality Date  . ABDOMINAL HYSTERECTOMY      Family History  Problem Relation Age of Onset  . Cancer Mother 55       leukemia   . Diabetes Father   . Cancer Father   . Arthritis Sister   . Dementia Brother   . Hip fracture Brother   . Diabetes Paternal Uncle     Social History   Socioeconomic History  . Marital status: Married    Spouse name: Not on file  . Number of children: Not on file  . Years of education: Not on file  . Highest education level: Not on file  Occupational History  . Occupation: retired   Scientific laboratory technician  . Financial resource strain: Not on file  . Food insecurity:    Worry: Not on file    Inability: Not on file  . Transportation needs:    Medical: Not on file    Non-medical: Not on file  Tobacco Use  . Smoking status: Former Research scientist (life sciences)  . Smokeless tobacco: Never Used  . Tobacco comment: Stopper smoking in 1960  Substance and Sexual Activity  . Alcohol use: No    Alcohol/week: 0.0 standard drinks  . Drug use: No  . Sexual activity: Never    Comment: lives with husband, no dietary restrictions.  Lifestyle  . Physical activity:    Days per week: Not on file    Minutes per session: Not on file  . Stress: Not on file    Relationships  . Social connections:    Talks on phone: Not on file    Gets together: Not on file    Attends religious service: Not on file    Active member of club or organization: Not on file    Attends meetings of clubs or organizations: Not on file    Relationship status: Not on file  . Intimate partner violence:    Fear of current or ex partner: Not on file    Emotionally abused: Not on file    Physically abused: Not on file    Forced sexual activity: Not on file  Other Topics Concern  . Not on file  Social History Narrative   Married cares for husband at home. No dietary restrictions    Outpatient Medications Prior to Visit  Medication Sig Dispense Refill  . ALPRAZolam (XANAX) 0.25 MG tablet TAKE 1 TO 2 TABLETS BY MOUTH TWO TIMES DAILY AS NEEDED FOR ANIXETY OR INSOMNIA 60 tablet 1  . Calcium Citrate-Vitamin D (CALCIUM CITRATE + PO) Take 2 tablets by mouth daily.    Marland Kitchen docusate sodium (COLACE) 100 MG capsule Take 100 mg by mouth daily as needed for mild constipation.    Marland Kitchen  gabapentin (NEURONTIN) 100 MG capsule TAKE 1 CAPSULE (100 MG TOTAL) BY MOUTH AT BEDTIME. 90 capsule 1  . lisinopril (PRINIVIL,ZESTRIL) 10 MG tablet TAKE 1 TABLET BY MOUTH TWICE A DAY 180 tablet 1  . memantine (NAMENDA) 5 MG tablet Take 1 tablet (5 mg total) by mouth daily. 90 tablet 1  . metoprolol succinate (TOPROL-XL) 50 MG 24 hr tablet TAKE 1 TABLET (50 MG TOTAL) ONCE DAILY BY MOUTH. TAKE WITH OR IMMEDIATELY FOLLOWING A MEAL. 90 tablet 1  . sertraline (ZOLOFT) 50 MG tablet TAKE 1 TABLET (50 MG TOTAL) ONCE DAILY 90 tablet 0   No facility-administered medications prior to visit.     Allergies  Allergen Reactions  . Fosamax [Alendronate Sodium] Other (See Comments)    Abdominal pain    Review of Systems  Constitutional: Negative for fever and malaise/fatigue.  HENT: Negative for congestion.   Eyes: Negative for blurred vision.  Respiratory: Negative for shortness of breath.   Cardiovascular:  Negative for chest pain, palpitations and leg swelling.  Gastrointestinal: Negative for abdominal pain, blood in stool and nausea.  Genitourinary: Negative for dysuria and frequency.  Musculoskeletal: Negative for falls.  Skin: Negative for rash.  Neurological: Negative for dizziness, loss of consciousness and headaches.  Endo/Heme/Allergies: Negative for environmental allergies.  Psychiatric/Behavioral: Negative for depression. The patient is not nervous/anxious.        Objective:    Physical Exam  Constitutional: She is oriented to person, place, and time. She appears well-developed and well-nourished. No distress.  HENT:  Head: Normocephalic and atraumatic.  Nose: Nose normal.  Eyes: Right eye exhibits no discharge. Left eye exhibits no discharge.  Neck: Normal range of motion. Neck supple.  Cardiovascular: Normal rate and regular rhythm.  No murmur heard. Pulmonary/Chest: Effort normal and breath sounds normal.  Abdominal: Soft. Bowel sounds are normal. There is no tenderness.  Musculoskeletal: She exhibits no edema.  Neurological: She is alert and oriented to person, place, and time.  Skin: Skin is warm and dry.  Psychiatric: She has a normal mood and affect.  Nursing note and vitals reviewed.   BP 120/63 (BP Location: Left Arm, Patient Position: Sitting, Cuff Size: Normal)   Pulse 64   Temp 98.4 F (36.9 C) (Oral)   Resp 18   Ht 5' (1.524 m)   Wt 94 lb (42.6 kg)   SpO2 98%   BMI 18.36 kg/m  Wt Readings from Last 3 Encounters:  03/08/18 94 lb (42.6 kg)  11/02/17 94 lb 3.2 oz (42.7 kg)  10/12/17 95 lb 6.4 oz (43.3 kg)     Lab Results  Component Value Date   WBC 3.3 (L) 11/02/2017   HGB 13.8 11/02/2017   HCT 41.6 11/02/2017   PLT 142.0 (L) 11/02/2017   GLUCOSE 83 11/02/2017   CHOL 224 (H) 11/02/2017   TRIG 100.0 11/02/2017   HDL 64.30 11/02/2017   LDLCALC 140 (H) 11/02/2017   ALT 8 11/02/2017   AST 18 11/02/2017   NA 141 11/02/2017   K 4.7 11/02/2017    CL 102 11/02/2017   CREATININE 0.91 11/02/2017   BUN 21 11/02/2017   CO2 32 11/02/2017   TSH 2.15 11/02/2017    Lab Results  Component Value Date   TSH 2.15 11/02/2017   Lab Results  Component Value Date   WBC 3.3 (L) 11/02/2017   HGB 13.8 11/02/2017   HCT 41.6 11/02/2017   MCV 90.9 11/02/2017   PLT 142.0 (L) 11/02/2017   Lab Results  Component Value Date   NA 141 11/02/2017   K 4.7 11/02/2017   CO2 32 11/02/2017   GLUCOSE 83 11/02/2017   BUN 21 11/02/2017   CREATININE 0.91 11/02/2017   BILITOT 0.6 11/02/2017   ALKPHOS 71 11/02/2017   AST 18 11/02/2017   ALT 8 11/02/2017   PROT 7.1 11/02/2017   ALBUMIN 4.1 11/02/2017   CALCIUM 10.0 11/02/2017   GFR 61.86 11/02/2017   Lab Results  Component Value Date   CHOL 224 (H) 11/02/2017   Lab Results  Component Value Date   HDL 64.30 11/02/2017   Lab Results  Component Value Date   LDLCALC 140 (H) 11/02/2017   Lab Results  Component Value Date   TRIG 100.0 11/02/2017   Lab Results  Component Value Date   CHOLHDL 3 11/02/2017   No results found for: HGBA1C     Assessment & Plan:   Problem List Items Addressed This Visit    Anxiety    Has been using 1 tab twice daily and another dose is used as needed for increased anxiety. Encouraged to monitor and consider dropping dosing further if any concerning side effects are noted. Doing well on Sertraline      Hyperlipidemia    Encouraged heart healthy diet, increase exercise, avoid trans fats, consider a krill oil cap daily      Hypertension    Well controlled, no changes to meds. Encouraged heart healthy diet such as the DASH diet and exercise as tolerated.        Other Visit Diagnoses    Needs flu shot    -  Primary   Relevant Orders   Flu vaccine HIGH DOSE PF (Fluzone High dose) (Completed)      I am having Peggy Peterson maintain her Calcium Citrate-Vitamin D (CALCIUM CITRATE + PO), docusate sodium, memantine, lisinopril, metoprolol succinate,  sertraline, gabapentin, and ALPRAZolam.  No orders of the defined types were placed in this encounter.    Penni Homans, MD

## 2018-03-08 NOTE — Patient Instructions (Addendum)
Shingrix is the new shingles shot, 2 shots over 2-6 months at the pharmacy Hypertension Hypertension is another name for high blood pressure. High blood pressure forces your heart to work harder to pump blood. This can cause problems over time. There are two numbers in a blood pressure reading. There is a top number (systolic) over a bottom number (diastolic). It is best to have a blood pressure below 120/80. Healthy choices can help lower your blood pressure. You may need medicine to help lower your blood pressure if:  Your blood pressure cannot be lowered with healthy choices.  Your blood pressure is higher than 130/80.  Follow these instructions at home: Eating and drinking  If directed, follow the DASH eating plan. This diet includes: ? Filling half of your plate at each meal with fruits and vegetables. ? Filling one quarter of your plate at each meal with whole grains. Whole grains include whole wheat pasta, brown rice, and whole grain bread. ? Eating or drinking low-fat dairy products, such as skim milk or low-fat yogurt. ? Filling one quarter of your plate at each meal with low-fat (lean) proteins. Low-fat proteins include fish, skinless chicken, eggs, beans, and tofu. ? Avoiding fatty meat, cured and processed meat, or chicken with skin. ? Avoiding premade or processed food.  Eat less than 1,500 mg of salt (sodium) a day.  Limit alcohol use to no more than 1 drink a day for nonpregnant women and 2 drinks a day for men. One drink equals 12 oz of beer, 5 oz of wine, or 1 oz of hard liquor. Lifestyle  Work with your doctor to stay at a healthy weight or to lose weight. Ask your doctor what the best weight is for you.  Get at least 30 minutes of exercise that causes your heart to beat faster (aerobic exercise) most days of the week. This may include walking, swimming, or biking.  Get at least 30 minutes of exercise that strengthens your muscles (resistance exercise) at least 3 days  a week. This may include lifting weights or pilates.  Do not use any products that contain nicotine or tobacco. This includes cigarettes and e-cigarettes. If you need help quitting, ask your doctor.  Check your blood pressure at home as told by your doctor.  Keep all follow-up visits as told by your doctor. This is important. Medicines  Take over-the-counter and prescription medicines only as told by your doctor. Follow directions carefully.  Do not skip doses of blood pressure medicine. The medicine does not work as well if you skip doses. Skipping doses also puts you at risk for problems.  Ask your doctor about side effects or reactions to medicines that you should watch for. Contact a doctor if:  You think you are having a reaction to the medicine you are taking.  You have headaches that keep coming back (recurring).  You feel dizzy.  You have swelling in your ankles.  You have trouble with your vision. Get help right away if:  You get a very bad headache.  You start to feel confused.  You feel weak or numb.  You feel faint.  You get very bad pain in your: ? Chest. ? Belly (abdomen).  You throw up (vomit) more than once.  You have trouble breathing. Summary  Hypertension is another name for high blood pressure.  Making healthy choices can help lower blood pressure. If your blood pressure cannot be controlled with healthy choices, you may need to take medicine. This  information is not intended to replace advice given to you by your health care provider. Make sure you discuss any questions you have with your health care provider. Document Released: 12/03/2007 Document Revised: 05/14/2016 Document Reviewed: 05/14/2016 Elsevier Interactive Patient Education  [employer].

## 2018-03-08 NOTE — Assessment & Plan Note (Signed)
Well controlled, no changes to meds. Encouraged heart healthy diet such as the DASH diet and exercise as tolerated.  °

## 2018-03-08 NOTE — Assessment & Plan Note (Addendum)
Has been using 1 tab twice daily and another dose is used as needed for increased anxiety. Encouraged to monitor and consider dropping dosing further if any concerning side effects are noted. Doing well on Sertraline

## 2018-03-17 ENCOUNTER — Other Ambulatory Visit: Payer: Self-pay | Admitting: Family Medicine

## 2018-03-25 ENCOUNTER — Other Ambulatory Visit: Payer: Self-pay | Admitting: Family Medicine

## 2018-03-25 DIAGNOSIS — F419 Anxiety disorder, unspecified: Secondary | ICD-10-CM

## 2018-03-31 ENCOUNTER — Other Ambulatory Visit: Payer: Self-pay | Admitting: Family Medicine

## 2018-04-01 ENCOUNTER — Encounter: Payer: Self-pay | Admitting: Family Medicine

## 2018-04-21 ENCOUNTER — Encounter: Payer: Self-pay | Admitting: Family Medicine

## 2018-04-21 ENCOUNTER — Other Ambulatory Visit: Payer: Self-pay | Admitting: Family Medicine

## 2018-04-21 DIAGNOSIS — F419 Anxiety disorder, unspecified: Secondary | ICD-10-CM

## 2018-04-21 MED ORDER — ALPRAZOLAM 0.25 MG PO TABS: ORAL_TABLET | ORAL | 1 refills | Status: AC

## 2018-04-21 NOTE — Telephone Encounter (Signed)
Refill request for alprazolam.   Last OV: 03/08/2018 Last Fill: 02/18/2018 #[id number] and 1RF UDS: 12/26/2016

## 2018-05-12 ENCOUNTER — Encounter: Payer: Self-pay | Admitting: Family Medicine

## 2018-05-12 ENCOUNTER — Other Ambulatory Visit: Payer: Self-pay | Admitting: Family Medicine

## 2018-05-13 ENCOUNTER — Encounter: Payer: Self-pay | Admitting: Family Medicine

## 2018-05-14 ENCOUNTER — Other Ambulatory Visit: Payer: Self-pay | Admitting: Family Medicine

## 2018-05-16 DIAGNOSIS — I4891 Unspecified atrial fibrillation: Secondary | ICD-10-CM | POA: Diagnosis not present

## 2018-05-16 DIAGNOSIS — R410 Disorientation, unspecified: Secondary | ICD-10-CM | POA: Diagnosis not present

## 2018-05-16 DIAGNOSIS — R404 Transient alteration of awareness: Secondary | ICD-10-CM | POA: Diagnosis not present

## 2018-05-16 DIAGNOSIS — S32010D Wedge compression fracture of first lumbar vertebra, subsequent encounter for fracture with routine healing: Secondary | ICD-10-CM | POA: Diagnosis not present

## 2018-05-16 DIAGNOSIS — S32012D Unstable burst fracture of first lumbar vertebra, subsequent encounter for fracture with routine healing: Secondary | ICD-10-CM | POA: Diagnosis not present

## 2018-05-16 DIAGNOSIS — I10 Essential (primary) hypertension: Secondary | ICD-10-CM | POA: Diagnosis not present

## 2018-05-16 DIAGNOSIS — R739 Hyperglycemia, unspecified: Secondary | ICD-10-CM | POA: Diagnosis not present

## 2018-05-16 DIAGNOSIS — S32002A Unstable burst fracture of unspecified lumbar vertebra, initial encounter for closed fracture: Secondary | ICD-10-CM | POA: Diagnosis not present

## 2018-05-16 DIAGNOSIS — E440 Moderate protein-calorie malnutrition: Secondary | ICD-10-CM | POA: Diagnosis not present

## 2018-05-16 DIAGNOSIS — M8000XA Age-related osteoporosis with current pathological fracture, unspecified site, initial encounter for fracture: Secondary | ICD-10-CM | POA: Diagnosis not present

## 2018-05-16 DIAGNOSIS — S32010A Wedge compression fracture of first lumbar vertebra, initial encounter for closed fracture: Secondary | ICD-10-CM | POA: Diagnosis not present

## 2018-05-16 DIAGNOSIS — M5489 Other dorsalgia: Secondary | ICD-10-CM | POA: Diagnosis not present

## 2018-05-16 DIAGNOSIS — S34101A Unspecified injury to L1 level of lumbar spinal cord, initial encounter: Secondary | ICD-10-CM | POA: Diagnosis not present

## 2018-05-16 DIAGNOSIS — M810 Age-related osteoporosis without current pathological fracture: Secondary | ICD-10-CM | POA: Diagnosis not present

## 2018-05-16 DIAGNOSIS — M4800 Spinal stenosis, site unspecified: Secondary | ICD-10-CM | POA: Diagnosis not present

## 2018-05-16 DIAGNOSIS — Z4789 Encounter for other orthopedic aftercare: Secondary | ICD-10-CM | POA: Diagnosis not present

## 2018-05-16 DIAGNOSIS — I9589 Other hypotension: Secondary | ICD-10-CM | POA: Diagnosis not present

## 2018-05-16 DIAGNOSIS — R278 Other lack of coordination: Secondary | ICD-10-CM | POA: Diagnosis not present

## 2018-05-16 DIAGNOSIS — R5381 Other malaise: Secondary | ICD-10-CM | POA: Diagnosis not present

## 2018-05-16 DIAGNOSIS — Y998 Other external cause status: Secondary | ICD-10-CM | POA: Diagnosis not present

## 2018-05-16 DIAGNOSIS — Z981 Arthrodesis status: Secondary | ICD-10-CM | POA: Diagnosis not present

## 2018-05-16 DIAGNOSIS — R2689 Other abnormalities of gait and mobility: Secondary | ICD-10-CM | POA: Diagnosis not present

## 2018-05-16 DIAGNOSIS — M6281 Muscle weakness (generalized): Secondary | ICD-10-CM | POA: Diagnosis not present

## 2018-05-16 DIAGNOSIS — S32040A Wedge compression fracture of fourth lumbar vertebra, initial encounter for closed fracture: Secondary | ICD-10-CM | POA: Diagnosis not present

## 2018-05-16 DIAGNOSIS — R52 Pain, unspecified: Secondary | ICD-10-CM | POA: Diagnosis not present

## 2018-05-16 DIAGNOSIS — I480 Paroxysmal atrial fibrillation: Secondary | ICD-10-CM | POA: Diagnosis not present

## 2018-05-16 DIAGNOSIS — Z411 Encounter for cosmetic surgery: Secondary | ICD-10-CM | POA: Diagnosis not present

## 2018-05-16 DIAGNOSIS — R41841 Cognitive communication deficit: Secondary | ICD-10-CM | POA: Diagnosis not present

## 2018-05-16 DIAGNOSIS — S32019A Unspecified fracture of first lumbar vertebra, initial encounter for closed fracture: Secondary | ICD-10-CM | POA: Diagnosis not present

## 2018-05-16 DIAGNOSIS — S3992XA Unspecified injury of lower back, initial encounter: Secondary | ICD-10-CM | POA: Diagnosis not present

## 2018-05-16 DIAGNOSIS — S32012A Unstable burst fracture of first lumbar vertebra, initial encounter for closed fracture: Secondary | ICD-10-CM | POA: Diagnosis not present

## 2018-05-16 DIAGNOSIS — R32 Unspecified urinary incontinence: Secondary | ICD-10-CM | POA: Diagnosis not present

## 2018-05-16 DIAGNOSIS — R0902 Hypoxemia: Secondary | ICD-10-CM | POA: Diagnosis not present

## 2018-05-16 DIAGNOSIS — Z7389 Other problems related to life management difficulty: Secondary | ICD-10-CM | POA: Diagnosis not present

## 2018-05-16 DIAGNOSIS — E785 Hyperlipidemia, unspecified: Secondary | ICD-10-CM | POA: Diagnosis not present

## 2018-05-16 DIAGNOSIS — R000 Tachycardia, unspecified: Secondary | ICD-10-CM | POA: Diagnosis not present

## 2018-05-16 DIAGNOSIS — R69 Illness, unspecified: Secondary | ICD-10-CM | POA: Diagnosis not present

## 2018-05-16 DIAGNOSIS — G9520 Unspecified cord compression: Secondary | ICD-10-CM | POA: Diagnosis not present

## 2018-05-16 DIAGNOSIS — W010XXA Fall on same level from slipping, tripping and stumbling without subsequent striking against object, initial encounter: Secondary | ICD-10-CM | POA: Diagnosis not present

## 2018-05-16 DIAGNOSIS — R4189 Other symptoms and signs involving cognitive functions and awareness: Secondary | ICD-10-CM | POA: Diagnosis not present

## 2018-05-16 DIAGNOSIS — R2681 Unsteadiness on feet: Secondary | ICD-10-CM | POA: Diagnosis not present

## 2018-05-16 DIAGNOSIS — W1830XA Fall on same level, unspecified, initial encounter: Secondary | ICD-10-CM | POA: Diagnosis not present

## 2018-05-16 DIAGNOSIS — W19XXXA Unspecified fall, initial encounter: Secondary | ICD-10-CM | POA: Diagnosis not present

## 2018-05-16 DIAGNOSIS — G9529 Other cord compression: Secondary | ICD-10-CM | POA: Diagnosis not present

## 2018-05-16 DIAGNOSIS — Z681 Body mass index (BMI) 19 or less, adult: Secondary | ICD-10-CM | POA: Diagnosis not present

## 2018-05-16 DIAGNOSIS — Z9181 History of falling: Secondary | ICD-10-CM | POA: Diagnosis not present

## 2018-05-19 ENCOUNTER — Encounter: Payer: Self-pay | Admitting: Family Medicine

## 2018-05-26 DIAGNOSIS — R2681 Unsteadiness on feet: Secondary | ICD-10-CM | POA: Diagnosis not present

## 2018-05-26 DIAGNOSIS — S32010D Wedge compression fracture of first lumbar vertebra, subsequent encounter for fracture with routine healing: Secondary | ICD-10-CM | POA: Diagnosis not present

## 2018-05-26 DIAGNOSIS — W19XXXA Unspecified fall, initial encounter: Secondary | ICD-10-CM | POA: Diagnosis not present

## 2018-05-26 DIAGNOSIS — G301 Alzheimer's disease with late onset: Secondary | ICD-10-CM | POA: Diagnosis not present

## 2018-05-26 DIAGNOSIS — Z9181 History of falling: Secondary | ICD-10-CM | POA: Diagnosis not present

## 2018-05-26 DIAGNOSIS — E785 Hyperlipidemia, unspecified: Secondary | ICD-10-CM | POA: Diagnosis not present

## 2018-05-26 DIAGNOSIS — Z9889 Other specified postprocedural states: Secondary | ICD-10-CM | POA: Diagnosis not present

## 2018-05-26 DIAGNOSIS — R278 Other lack of coordination: Secondary | ICD-10-CM | POA: Diagnosis not present

## 2018-05-26 DIAGNOSIS — R41841 Cognitive communication deficit: Secondary | ICD-10-CM | POA: Diagnosis not present

## 2018-05-26 DIAGNOSIS — R404 Transient alteration of awareness: Secondary | ICD-10-CM | POA: Diagnosis not present

## 2018-05-26 DIAGNOSIS — G9529 Other cord compression: Secondary | ICD-10-CM | POA: Diagnosis not present

## 2018-05-26 DIAGNOSIS — R2689 Other abnormalities of gait and mobility: Secondary | ICD-10-CM | POA: Diagnosis not present

## 2018-05-26 DIAGNOSIS — I480 Paroxysmal atrial fibrillation: Secondary | ICD-10-CM | POA: Diagnosis not present

## 2018-05-26 DIAGNOSIS — M6281 Muscle weakness (generalized): Secondary | ICD-10-CM | POA: Diagnosis not present

## 2018-05-26 DIAGNOSIS — M810 Age-related osteoporosis without current pathological fracture: Secondary | ICD-10-CM | POA: Diagnosis not present

## 2018-05-26 DIAGNOSIS — Z4789 Encounter for other orthopedic aftercare: Secondary | ICD-10-CM | POA: Diagnosis not present

## 2018-05-26 DIAGNOSIS — I10 Essential (primary) hypertension: Secondary | ICD-10-CM | POA: Diagnosis not present

## 2018-05-26 DIAGNOSIS — R69 Illness, unspecified: Secondary | ICD-10-CM | POA: Diagnosis not present

## 2018-05-26 DIAGNOSIS — M549 Dorsalgia, unspecified: Secondary | ICD-10-CM | POA: Diagnosis not present

## 2018-05-26 DIAGNOSIS — R410 Disorientation, unspecified: Secondary | ICD-10-CM | POA: Diagnosis not present

## 2018-05-26 DIAGNOSIS — S32012A Unstable burst fracture of first lumbar vertebra, initial encounter for closed fracture: Secondary | ICD-10-CM | POA: Diagnosis not present

## 2018-05-31 DIAGNOSIS — Z9889 Other specified postprocedural states: Secondary | ICD-10-CM | POA: Diagnosis not present

## 2018-05-31 DIAGNOSIS — R69 Illness, unspecified: Secondary | ICD-10-CM | POA: Diagnosis not present

## 2018-05-31 DIAGNOSIS — G301 Alzheimer's disease with late onset: Secondary | ICD-10-CM | POA: Diagnosis not present

## 2018-05-31 DIAGNOSIS — S32010D Wedge compression fracture of first lumbar vertebra, subsequent encounter for fracture with routine healing: Secondary | ICD-10-CM | POA: Diagnosis not present

## 2018-06-08 DIAGNOSIS — M549 Dorsalgia, unspecified: Secondary | ICD-10-CM | POA: Diagnosis not present

## 2018-06-15 DIAGNOSIS — R69 Illness, unspecified: Secondary | ICD-10-CM | POA: Diagnosis not present

## 2018-06-15 DIAGNOSIS — F0280 Dementia in other diseases classified elsewhere without behavioral disturbance: Secondary | ICD-10-CM | POA: Diagnosis not present

## 2018-06-15 DIAGNOSIS — G301 Alzheimer's disease with late onset: Secondary | ICD-10-CM | POA: Diagnosis not present

## 2018-06-15 DIAGNOSIS — S32010D Wedge compression fracture of first lumbar vertebra, subsequent encounter for fracture with routine healing: Secondary | ICD-10-CM | POA: Diagnosis not present

## 2018-06-17 DIAGNOSIS — I10 Essential (primary) hypertension: Secondary | ICD-10-CM | POA: Diagnosis not present

## 2018-06-17 DIAGNOSIS — I2510 Atherosclerotic heart disease of native coronary artery without angina pectoris: Secondary | ICD-10-CM | POA: Diagnosis not present

## 2018-06-17 DIAGNOSIS — F419 Anxiety disorder, unspecified: Secondary | ICD-10-CM | POA: Diagnosis not present

## 2018-06-17 DIAGNOSIS — G9009 Other idiopathic peripheral autonomic neuropathy: Secondary | ICD-10-CM | POA: Diagnosis not present

## 2018-06-17 DIAGNOSIS — F331 Major depressive disorder, recurrent, moderate: Secondary | ICD-10-CM | POA: Diagnosis not present

## 2018-06-17 DIAGNOSIS — E785 Hyperlipidemia, unspecified: Secondary | ICD-10-CM | POA: Diagnosis not present

## 2018-06-17 DIAGNOSIS — E559 Vitamin D deficiency, unspecified: Secondary | ICD-10-CM | POA: Diagnosis not present

## 2018-06-17 DIAGNOSIS — D510 Vitamin B12 deficiency anemia due to intrinsic factor deficiency: Secondary | ICD-10-CM | POA: Diagnosis not present

## 2018-06-17 DIAGNOSIS — E119 Type 2 diabetes mellitus without complications: Secondary | ICD-10-CM | POA: Diagnosis not present

## 2018-06-17 DIAGNOSIS — E039 Hypothyroidism, unspecified: Secondary | ICD-10-CM | POA: Diagnosis not present

## 2018-06-26 DIAGNOSIS — M5136 Other intervertebral disc degeneration, lumbar region: Secondary | ICD-10-CM | POA: Diagnosis not present

## 2018-06-26 DIAGNOSIS — I480 Paroxysmal atrial fibrillation: Secondary | ICD-10-CM | POA: Diagnosis not present

## 2018-06-26 DIAGNOSIS — G9529 Other cord compression: Secondary | ICD-10-CM | POA: Diagnosis not present

## 2018-06-26 DIAGNOSIS — Z981 Arthrodesis status: Secondary | ICD-10-CM | POA: Diagnosis not present

## 2018-06-26 DIAGNOSIS — M48061 Spinal stenosis, lumbar region without neurogenic claudication: Secondary | ICD-10-CM | POA: Diagnosis not present

## 2018-06-26 DIAGNOSIS — I10 Essential (primary) hypertension: Secondary | ICD-10-CM | POA: Diagnosis not present

## 2018-06-26 DIAGNOSIS — W19XXXD Unspecified fall, subsequent encounter: Secondary | ICD-10-CM | POA: Diagnosis not present

## 2018-06-26 DIAGNOSIS — R69 Illness, unspecified: Secondary | ICD-10-CM | POA: Diagnosis not present

## 2018-06-26 DIAGNOSIS — M8008XD Age-related osteoporosis with current pathological fracture, vertebra(e), subsequent encounter for fracture with routine healing: Secondary | ICD-10-CM | POA: Diagnosis not present

## 2018-06-28 DIAGNOSIS — W19XXXD Unspecified fall, subsequent encounter: Secondary | ICD-10-CM | POA: Diagnosis not present

## 2018-06-28 DIAGNOSIS — I480 Paroxysmal atrial fibrillation: Secondary | ICD-10-CM | POA: Diagnosis not present

## 2018-06-28 DIAGNOSIS — Z981 Arthrodesis status: Secondary | ICD-10-CM | POA: Diagnosis not present

## 2018-06-28 DIAGNOSIS — I10 Essential (primary) hypertension: Secondary | ICD-10-CM | POA: Diagnosis not present

## 2018-06-28 DIAGNOSIS — M5136 Other intervertebral disc degeneration, lumbar region: Secondary | ICD-10-CM | POA: Diagnosis not present

## 2018-06-28 DIAGNOSIS — M8008XD Age-related osteoporosis with current pathological fracture, vertebra(e), subsequent encounter for fracture with routine healing: Secondary | ICD-10-CM | POA: Diagnosis not present

## 2018-06-28 DIAGNOSIS — R69 Illness, unspecified: Secondary | ICD-10-CM | POA: Diagnosis not present

## 2018-06-28 DIAGNOSIS — M48061 Spinal stenosis, lumbar region without neurogenic claudication: Secondary | ICD-10-CM | POA: Diagnosis not present

## 2018-06-28 DIAGNOSIS — G9529 Other cord compression: Secondary | ICD-10-CM | POA: Diagnosis not present

## 2018-06-29 DIAGNOSIS — W19XXXD Unspecified fall, subsequent encounter: Secondary | ICD-10-CM | POA: Diagnosis not present

## 2018-06-29 DIAGNOSIS — G9529 Other cord compression: Secondary | ICD-10-CM | POA: Diagnosis not present

## 2018-06-29 DIAGNOSIS — I480 Paroxysmal atrial fibrillation: Secondary | ICD-10-CM | POA: Diagnosis not present

## 2018-06-29 DIAGNOSIS — M48061 Spinal stenosis, lumbar region without neurogenic claudication: Secondary | ICD-10-CM | POA: Diagnosis not present

## 2018-06-29 DIAGNOSIS — M8008XD Age-related osteoporosis with current pathological fracture, vertebra(e), subsequent encounter for fracture with routine healing: Secondary | ICD-10-CM | POA: Diagnosis not present

## 2018-06-29 DIAGNOSIS — I10 Essential (primary) hypertension: Secondary | ICD-10-CM | POA: Diagnosis not present

## 2018-06-29 DIAGNOSIS — R69 Illness, unspecified: Secondary | ICD-10-CM | POA: Diagnosis not present

## 2018-06-29 DIAGNOSIS — M5136 Other intervertebral disc degeneration, lumbar region: Secondary | ICD-10-CM | POA: Diagnosis not present

## 2018-06-29 DIAGNOSIS — Z981 Arthrodesis status: Secondary | ICD-10-CM | POA: Diagnosis not present

## 2018-07-02 DIAGNOSIS — G3184 Mild cognitive impairment, so stated: Secondary | ICD-10-CM | POA: Diagnosis not present

## 2018-07-02 DIAGNOSIS — F4321 Adjustment disorder with depressed mood: Secondary | ICD-10-CM | POA: Diagnosis not present

## 2018-07-05 DIAGNOSIS — F331 Major depressive disorder, recurrent, moderate: Secondary | ICD-10-CM | POA: Diagnosis not present

## 2018-07-05 DIAGNOSIS — F064 Anxiety disorder due to known physiological condition: Secondary | ICD-10-CM | POA: Diagnosis not present

## 2018-07-05 DIAGNOSIS — G301 Alzheimer's disease with late onset: Secondary | ICD-10-CM | POA: Diagnosis not present

## 2018-07-05 DIAGNOSIS — F0281 Dementia in other diseases classified elsewhere with behavioral disturbance: Secondary | ICD-10-CM | POA: Diagnosis not present

## 2018-07-08 DIAGNOSIS — R531 Weakness: Secondary | ICD-10-CM | POA: Diagnosis not present

## 2018-07-12 ENCOUNTER — Ambulatory Visit: Payer: Medicare HMO | Admitting: Family Medicine

## 2018-07-15 DIAGNOSIS — M62830 Muscle spasm of back: Secondary | ICD-10-CM | POA: Diagnosis not present

## 2018-07-15 DIAGNOSIS — M545 Low back pain: Secondary | ICD-10-CM | POA: Diagnosis not present

## 2018-07-17 ENCOUNTER — Other Ambulatory Visit: Payer: Self-pay | Admitting: Family Medicine

## 2018-07-22 DIAGNOSIS — R300 Dysuria: Secondary | ICD-10-CM | POA: Diagnosis not present

## 2018-07-22 DIAGNOSIS — Z0000 Encounter for general adult medical examination without abnormal findings: Secondary | ICD-10-CM | POA: Diagnosis not present

## 2018-07-26 ENCOUNTER — Other Ambulatory Visit: Payer: Self-pay | Admitting: Family Medicine

## 2018-07-28 DIAGNOSIS — Z981 Arthrodesis status: Secondary | ICD-10-CM | POA: Diagnosis not present

## 2018-07-28 DIAGNOSIS — M8008XD Age-related osteoporosis with current pathological fracture, vertebra(e), subsequent encounter for fracture with routine healing: Secondary | ICD-10-CM | POA: Diagnosis not present

## 2018-07-28 DIAGNOSIS — M5136 Other intervertebral disc degeneration, lumbar region: Secondary | ICD-10-CM | POA: Diagnosis not present

## 2018-07-28 DIAGNOSIS — M48061 Spinal stenosis, lumbar region without neurogenic claudication: Secondary | ICD-10-CM | POA: Diagnosis not present

## 2018-08-02 DIAGNOSIS — I10 Essential (primary) hypertension: Secondary | ICD-10-CM | POA: Diagnosis not present

## 2018-08-02 DIAGNOSIS — Y92018 Other place in single-family (private) house as the place of occurrence of the external cause: Secondary | ICD-10-CM | POA: Diagnosis not present

## 2018-08-02 DIAGNOSIS — W1839XA Other fall on same level, initial encounter: Secondary | ICD-10-CM | POA: Diagnosis not present

## 2018-08-02 DIAGNOSIS — S32010A Wedge compression fracture of first lumbar vertebra, initial encounter for closed fracture: Secondary | ICD-10-CM | POA: Diagnosis not present

## 2018-08-02 DIAGNOSIS — M545 Low back pain: Secondary | ICD-10-CM | POA: Diagnosis not present

## 2018-08-02 DIAGNOSIS — R413 Other amnesia: Secondary | ICD-10-CM | POA: Diagnosis not present

## 2018-08-02 DIAGNOSIS — F418 Other specified anxiety disorders: Secondary | ICD-10-CM | POA: Diagnosis not present

## 2018-08-02 DIAGNOSIS — M546 Pain in thoracic spine: Secondary | ICD-10-CM | POA: Diagnosis not present

## 2018-08-13 DIAGNOSIS — N390 Urinary tract infection, site not specified: Secondary | ICD-10-CM | POA: Diagnosis not present

## 2018-08-20 DIAGNOSIS — R4182 Altered mental status, unspecified: Secondary | ICD-10-CM | POA: Diagnosis not present

## 2018-08-20 DIAGNOSIS — I4891 Unspecified atrial fibrillation: Secondary | ICD-10-CM | POA: Diagnosis not present

## 2018-08-20 DIAGNOSIS — G301 Alzheimer's disease with late onset: Secondary | ICD-10-CM | POA: Diagnosis not present

## 2018-08-20 DIAGNOSIS — I10 Essential (primary) hypertension: Secondary | ICD-10-CM | POA: Diagnosis not present

## 2018-08-20 DIAGNOSIS — Z681 Body mass index (BMI) 19 or less, adult: Secondary | ICD-10-CM | POA: Diagnosis not present

## 2018-08-20 DIAGNOSIS — F0281 Dementia in other diseases classified elsewhere with behavioral disturbance: Secondary | ICD-10-CM | POA: Diagnosis not present

## 2018-08-20 DIAGNOSIS — R5381 Other malaise: Secondary | ICD-10-CM | POA: Diagnosis not present

## 2018-08-20 DIAGNOSIS — R2681 Unsteadiness on feet: Secondary | ICD-10-CM | POA: Diagnosis not present

## 2018-08-20 DIAGNOSIS — I480 Paroxysmal atrial fibrillation: Secondary | ICD-10-CM | POA: Diagnosis not present

## 2018-08-20 DIAGNOSIS — R69 Illness, unspecified: Secondary | ICD-10-CM | POA: Diagnosis not present

## 2018-08-21 DIAGNOSIS — F0390 Unspecified dementia without behavioral disturbance: Secondary | ICD-10-CM | POA: Diagnosis not present

## 2018-08-21 DIAGNOSIS — I480 Paroxysmal atrial fibrillation: Secondary | ICD-10-CM | POA: Diagnosis not present

## 2018-08-21 DIAGNOSIS — G9340 Encephalopathy, unspecified: Secondary | ICD-10-CM | POA: Diagnosis not present

## 2018-08-22 DIAGNOSIS — R4182 Altered mental status, unspecified: Secondary | ICD-10-CM | POA: Diagnosis not present

## 2018-08-22 DIAGNOSIS — G301 Alzheimer's disease with late onset: Secondary | ICD-10-CM | POA: Diagnosis present

## 2018-08-22 DIAGNOSIS — I10 Essential (primary) hypertension: Secondary | ICD-10-CM | POA: Diagnosis present

## 2018-08-22 DIAGNOSIS — R636 Underweight: Secondary | ICD-10-CM | POA: Diagnosis present

## 2018-08-22 DIAGNOSIS — Z7982 Long term (current) use of aspirin: Secondary | ICD-10-CM | POA: Diagnosis not present

## 2018-08-22 DIAGNOSIS — R2681 Unsteadiness on feet: Secondary | ICD-10-CM | POA: Diagnosis present

## 2018-08-22 DIAGNOSIS — J90 Pleural effusion, not elsewhere classified: Secondary | ICD-10-CM | POA: Diagnosis not present

## 2018-08-22 DIAGNOSIS — Z681 Body mass index (BMI) 19 or less, adult: Secondary | ICD-10-CM | POA: Diagnosis not present

## 2018-08-22 DIAGNOSIS — G9340 Encephalopathy, unspecified: Secondary | ICD-10-CM | POA: Diagnosis not present

## 2018-08-22 DIAGNOSIS — F0390 Unspecified dementia without behavioral disturbance: Secondary | ICD-10-CM | POA: Diagnosis not present

## 2018-08-22 DIAGNOSIS — I480 Paroxysmal atrial fibrillation: Secondary | ICD-10-CM | POA: Diagnosis present

## 2018-08-22 DIAGNOSIS — Z634 Disappearance and death of family member: Secondary | ICD-10-CM | POA: Diagnosis not present

## 2018-08-22 DIAGNOSIS — D490 Neoplasm of unspecified behavior of digestive system: Secondary | ICD-10-CM | POA: Diagnosis present

## 2018-08-22 DIAGNOSIS — F0281 Dementia in other diseases classified elsewhere with behavioral disturbance: Secondary | ICD-10-CM | POA: Diagnosis present

## 2018-08-22 DIAGNOSIS — Z7901 Long term (current) use of anticoagulants: Secondary | ICD-10-CM | POA: Diagnosis not present

## 2018-08-22 DIAGNOSIS — Z87891 Personal history of nicotine dependence: Secondary | ICD-10-CM | POA: Diagnosis not present

## 2018-08-22 DIAGNOSIS — R410 Disorientation, unspecified: Secondary | ICD-10-CM | POA: Diagnosis not present

## 2018-08-31 DIAGNOSIS — G9340 Encephalopathy, unspecified: Secondary | ICD-10-CM | POA: Diagnosis not present

## 2018-08-31 DIAGNOSIS — K219 Gastro-esophageal reflux disease without esophagitis: Secondary | ICD-10-CM | POA: Diagnosis not present

## 2018-08-31 DIAGNOSIS — Z7982 Long term (current) use of aspirin: Secondary | ICD-10-CM | POA: Diagnosis not present

## 2018-08-31 DIAGNOSIS — S0083XD Contusion of other part of head, subsequent encounter: Secondary | ICD-10-CM | POA: Diagnosis not present

## 2018-08-31 DIAGNOSIS — I480 Paroxysmal atrial fibrillation: Secondary | ICD-10-CM | POA: Diagnosis not present

## 2018-08-31 DIAGNOSIS — Z9183 Wandering in diseases classified elsewhere: Secondary | ICD-10-CM | POA: Diagnosis not present

## 2018-08-31 DIAGNOSIS — K5790 Diverticulosis of intestine, part unspecified, without perforation or abscess without bleeding: Secondary | ICD-10-CM | POA: Diagnosis not present

## 2018-08-31 DIAGNOSIS — M545 Low back pain: Secondary | ICD-10-CM | POA: Diagnosis not present

## 2018-08-31 DIAGNOSIS — Z7901 Long term (current) use of anticoagulants: Secondary | ICD-10-CM | POA: Diagnosis not present

## 2018-08-31 DIAGNOSIS — I10 Essential (primary) hypertension: Secondary | ICD-10-CM | POA: Diagnosis not present

## 2018-08-31 DIAGNOSIS — D110 Benign neoplasm of parotid gland: Secondary | ICD-10-CM | POA: Diagnosis not present

## 2018-08-31 DIAGNOSIS — F0281 Dementia in other diseases classified elsewhere with behavioral disturbance: Secondary | ICD-10-CM | POA: Diagnosis not present

## 2018-08-31 DIAGNOSIS — Z981 Arthrodesis status: Secondary | ICD-10-CM | POA: Diagnosis not present

## 2018-08-31 DIAGNOSIS — Z87891 Personal history of nicotine dependence: Secondary | ICD-10-CM | POA: Diagnosis not present

## 2018-08-31 DIAGNOSIS — G8929 Other chronic pain: Secondary | ICD-10-CM | POA: Diagnosis not present

## 2018-08-31 DIAGNOSIS — Z9181 History of falling: Secondary | ICD-10-CM | POA: Diagnosis not present

## 2018-08-31 DIAGNOSIS — M8008XD Age-related osteoporosis with current pathological fracture, vertebra(e), subsequent encounter for fracture with routine healing: Secondary | ICD-10-CM | POA: Diagnosis not present

## 2018-08-31 DIAGNOSIS — S7001XD Contusion of right hip, subsequent encounter: Secondary | ICD-10-CM | POA: Diagnosis not present

## 2018-08-31 DIAGNOSIS — G309 Alzheimer's disease, unspecified: Secondary | ICD-10-CM | POA: Diagnosis not present

## 2018-08-31 DIAGNOSIS — F413 Other mixed anxiety disorders: Secondary | ICD-10-CM | POA: Diagnosis not present

## 2018-09-01 DIAGNOSIS — F0281 Dementia in other diseases classified elsewhere with behavioral disturbance: Secondary | ICD-10-CM | POA: Diagnosis not present

## 2018-09-01 DIAGNOSIS — Z9183 Wandering in diseases classified elsewhere: Secondary | ICD-10-CM | POA: Diagnosis not present

## 2018-09-01 DIAGNOSIS — M545 Low back pain: Secondary | ICD-10-CM | POA: Diagnosis not present

## 2018-09-01 DIAGNOSIS — I480 Paroxysmal atrial fibrillation: Secondary | ICD-10-CM | POA: Diagnosis not present

## 2018-09-01 DIAGNOSIS — G8929 Other chronic pain: Secondary | ICD-10-CM | POA: Diagnosis not present

## 2018-09-01 DIAGNOSIS — G309 Alzheimer's disease, unspecified: Secondary | ICD-10-CM | POA: Diagnosis not present

## 2018-09-02 DIAGNOSIS — M6281 Muscle weakness (generalized): Secondary | ICD-10-CM | POA: Diagnosis not present

## 2018-09-02 DIAGNOSIS — F419 Anxiety disorder, unspecified: Secondary | ICD-10-CM | POA: Diagnosis not present

## 2018-09-02 DIAGNOSIS — I480 Paroxysmal atrial fibrillation: Secondary | ICD-10-CM | POA: Diagnosis not present

## 2018-09-02 DIAGNOSIS — I10 Essential (primary) hypertension: Secondary | ICD-10-CM | POA: Diagnosis not present

## 2018-09-02 DIAGNOSIS — M2550 Pain in unspecified joint: Secondary | ICD-10-CM | POA: Diagnosis not present

## 2018-09-02 DIAGNOSIS — Z09 Encounter for follow-up examination after completed treatment for conditions other than malignant neoplasm: Secondary | ICD-10-CM | POA: Diagnosis not present

## 2018-09-03 DIAGNOSIS — F0281 Dementia in other diseases classified elsewhere with behavioral disturbance: Secondary | ICD-10-CM | POA: Diagnosis not present

## 2018-09-03 DIAGNOSIS — G8929 Other chronic pain: Secondary | ICD-10-CM | POA: Diagnosis not present

## 2018-09-03 DIAGNOSIS — G309 Alzheimer's disease, unspecified: Secondary | ICD-10-CM | POA: Diagnosis not present

## 2018-09-03 DIAGNOSIS — Z9183 Wandering in diseases classified elsewhere: Secondary | ICD-10-CM | POA: Diagnosis not present

## 2018-09-03 DIAGNOSIS — I480 Paroxysmal atrial fibrillation: Secondary | ICD-10-CM | POA: Diagnosis not present

## 2018-09-03 DIAGNOSIS — M545 Low back pain: Secondary | ICD-10-CM | POA: Diagnosis not present

## 2018-09-06 DIAGNOSIS — F0281 Dementia in other diseases classified elsewhere with behavioral disturbance: Secondary | ICD-10-CM | POA: Diagnosis not present

## 2018-09-06 DIAGNOSIS — M545 Low back pain: Secondary | ICD-10-CM | POA: Diagnosis not present

## 2018-09-06 DIAGNOSIS — G309 Alzheimer's disease, unspecified: Secondary | ICD-10-CM | POA: Diagnosis not present

## 2018-09-06 DIAGNOSIS — Z9183 Wandering in diseases classified elsewhere: Secondary | ICD-10-CM | POA: Diagnosis not present

## 2018-09-06 DIAGNOSIS — I480 Paroxysmal atrial fibrillation: Secondary | ICD-10-CM | POA: Diagnosis not present

## 2018-09-06 DIAGNOSIS — G8929 Other chronic pain: Secondary | ICD-10-CM | POA: Diagnosis not present

## 2018-09-06 DIAGNOSIS — F329 Major depressive disorder, single episode, unspecified: Secondary | ICD-10-CM | POA: Diagnosis not present

## 2018-09-06 DIAGNOSIS — F419 Anxiety disorder, unspecified: Secondary | ICD-10-CM | POA: Diagnosis not present

## 2018-09-06 DIAGNOSIS — F0390 Unspecified dementia without behavioral disturbance: Secondary | ICD-10-CM | POA: Diagnosis not present

## 2018-09-07 DIAGNOSIS — I480 Paroxysmal atrial fibrillation: Secondary | ICD-10-CM | POA: Diagnosis not present

## 2018-09-07 DIAGNOSIS — G8929 Other chronic pain: Secondary | ICD-10-CM | POA: Diagnosis not present

## 2018-09-07 DIAGNOSIS — M545 Low back pain: Secondary | ICD-10-CM | POA: Diagnosis not present

## 2018-09-07 DIAGNOSIS — G309 Alzheimer's disease, unspecified: Secondary | ICD-10-CM | POA: Diagnosis not present

## 2018-09-07 DIAGNOSIS — F0281 Dementia in other diseases classified elsewhere with behavioral disturbance: Secondary | ICD-10-CM | POA: Diagnosis not present

## 2018-09-07 DIAGNOSIS — Z9183 Wandering in diseases classified elsewhere: Secondary | ICD-10-CM | POA: Diagnosis not present

## 2018-09-10 DIAGNOSIS — G8929 Other chronic pain: Secondary | ICD-10-CM | POA: Diagnosis not present

## 2018-09-10 DIAGNOSIS — M545 Low back pain: Secondary | ICD-10-CM | POA: Diagnosis not present

## 2018-09-10 DIAGNOSIS — F0281 Dementia in other diseases classified elsewhere with behavioral disturbance: Secondary | ICD-10-CM | POA: Diagnosis not present

## 2018-09-10 DIAGNOSIS — G309 Alzheimer's disease, unspecified: Secondary | ICD-10-CM | POA: Diagnosis not present

## 2018-09-10 DIAGNOSIS — I480 Paroxysmal atrial fibrillation: Secondary | ICD-10-CM | POA: Diagnosis not present

## 2018-09-10 DIAGNOSIS — Z9183 Wandering in diseases classified elsewhere: Secondary | ICD-10-CM | POA: Diagnosis not present

## 2018-09-13 DIAGNOSIS — G309 Alzheimer's disease, unspecified: Secondary | ICD-10-CM | POA: Diagnosis not present

## 2018-09-13 DIAGNOSIS — Z9183 Wandering in diseases classified elsewhere: Secondary | ICD-10-CM | POA: Diagnosis not present

## 2018-09-13 DIAGNOSIS — M545 Low back pain: Secondary | ICD-10-CM | POA: Diagnosis not present

## 2018-09-13 DIAGNOSIS — G8929 Other chronic pain: Secondary | ICD-10-CM | POA: Diagnosis not present

## 2018-09-13 DIAGNOSIS — F0281 Dementia in other diseases classified elsewhere with behavioral disturbance: Secondary | ICD-10-CM | POA: Diagnosis not present

## 2018-09-13 DIAGNOSIS — I480 Paroxysmal atrial fibrillation: Secondary | ICD-10-CM | POA: Diagnosis not present

## 2018-09-29 DIAGNOSIS — R309 Painful micturition, unspecified: Secondary | ICD-10-CM | POA: Diagnosis not present

## 2018-10-29 DEATH — deceased

## 2018-11-18 DIAGNOSIS — M62471 Contracture of muscle, right ankle and foot: Secondary | ICD-10-CM | POA: Diagnosis not present

## 2018-11-18 DIAGNOSIS — M7661 Achilles tendinitis, right leg: Secondary | ICD-10-CM | POA: Diagnosis not present

## 2018-11-18 DIAGNOSIS — M6701 Short Achilles tendon (acquired), right ankle: Secondary | ICD-10-CM | POA: Diagnosis not present

## 2018-11-18 DIAGNOSIS — R2689 Other abnormalities of gait and mobility: Secondary | ICD-10-CM | POA: Diagnosis not present

## 2018-11-18 DIAGNOSIS — R413 Other amnesia: Secondary | ICD-10-CM | POA: Diagnosis not present

## 2018-11-18 DIAGNOSIS — M7741 Metatarsalgia, right foot: Secondary | ICD-10-CM | POA: Diagnosis not present

## 2018-11-18 DIAGNOSIS — M65871 Other synovitis and tenosynovitis, right ankle and foot: Secondary | ICD-10-CM | POA: Diagnosis not present

## 2018-11-18 DIAGNOSIS — M2141 Flat foot [pes planus] (acquired), right foot: Secondary | ICD-10-CM | POA: Diagnosis not present

## 2018-11-18 DIAGNOSIS — I10 Essential (primary) hypertension: Secondary | ICD-10-CM | POA: Diagnosis not present

## 2018-11-18 DIAGNOSIS — M545 Low back pain: Secondary | ICD-10-CM | POA: Diagnosis not present

## 2018-11-18 DIAGNOSIS — F418 Other specified anxiety disorders: Secondary | ICD-10-CM | POA: Diagnosis not present

## 2018-11-30 DIAGNOSIS — R4182 Altered mental status, unspecified: Secondary | ICD-10-CM | POA: Diagnosis not present

## 2018-11-30 DIAGNOSIS — R404 Transient alteration of awareness: Secondary | ICD-10-CM | POA: Diagnosis not present

## 2018-11-30 DIAGNOSIS — F0390 Unspecified dementia without behavioral disturbance: Secondary | ICD-10-CM | POA: Diagnosis not present

## 2018-11-30 DIAGNOSIS — I10 Essential (primary) hypertension: Secondary | ICD-10-CM | POA: Diagnosis not present

## 2018-11-30 DIAGNOSIS — Z87891 Personal history of nicotine dependence: Secondary | ICD-10-CM | POA: Diagnosis not present

## 2018-11-30 DIAGNOSIS — I4891 Unspecified atrial fibrillation: Secondary | ICD-10-CM | POA: Diagnosis not present

## 2018-11-30 DIAGNOSIS — Z7901 Long term (current) use of anticoagulants: Secondary | ICD-10-CM | POA: Diagnosis not present

## 2018-12-01 DIAGNOSIS — I4891 Unspecified atrial fibrillation: Secondary | ICD-10-CM | POA: Diagnosis not present

## 2018-12-01 DIAGNOSIS — I10 Essential (primary) hypertension: Secondary | ICD-10-CM | POA: Diagnosis not present

## 2018-12-01 DIAGNOSIS — R404 Transient alteration of awareness: Secondary | ICD-10-CM | POA: Diagnosis not present

## 2018-12-01 DIAGNOSIS — R918 Other nonspecific abnormal finding of lung field: Secondary | ICD-10-CM | POA: Diagnosis not present

## 2018-12-01 DIAGNOSIS — R4182 Altered mental status, unspecified: Secondary | ICD-10-CM | POA: Diagnosis not present

## 2018-12-02 DIAGNOSIS — R404 Transient alteration of awareness: Secondary | ICD-10-CM | POA: Diagnosis not present

## 2018-12-03 DIAGNOSIS — R404 Transient alteration of awareness: Secondary | ICD-10-CM | POA: Diagnosis not present

## 2018-12-04 DIAGNOSIS — M6281 Muscle weakness (generalized): Secondary | ICD-10-CM | POA: Diagnosis not present

## 2018-12-04 DIAGNOSIS — R404 Transient alteration of awareness: Secondary | ICD-10-CM | POA: Diagnosis not present

## 2018-12-04 DIAGNOSIS — F0390 Unspecified dementia without behavioral disturbance: Secondary | ICD-10-CM | POA: Diagnosis not present

## 2018-12-07 DIAGNOSIS — R451 Restlessness and agitation: Secondary | ICD-10-CM | POA: Diagnosis not present

## 2018-12-07 DIAGNOSIS — I10 Essential (primary) hypertension: Secondary | ICD-10-CM | POA: Diagnosis not present

## 2018-12-07 DIAGNOSIS — I4891 Unspecified atrial fibrillation: Secondary | ICD-10-CM | POA: Diagnosis not present

## 2018-12-07 DIAGNOSIS — G9341 Metabolic encephalopathy: Secondary | ICD-10-CM | POA: Diagnosis not present

## 2018-12-07 DIAGNOSIS — G629 Polyneuropathy, unspecified: Secondary | ICD-10-CM | POA: Diagnosis not present

## 2018-12-07 DIAGNOSIS — F0390 Unspecified dementia without behavioral disturbance: Secondary | ICD-10-CM | POA: Diagnosis not present

## 2018-12-07 DIAGNOSIS — F329 Major depressive disorder, single episode, unspecified: Secondary | ICD-10-CM | POA: Diagnosis not present

## 2018-12-09 DIAGNOSIS — E039 Hypothyroidism, unspecified: Secondary | ICD-10-CM | POA: Diagnosis not present

## 2018-12-09 DIAGNOSIS — R2681 Unsteadiness on feet: Secondary | ICD-10-CM | POA: Diagnosis not present

## 2018-12-09 DIAGNOSIS — Z79899 Other long term (current) drug therapy: Secondary | ICD-10-CM | POA: Diagnosis not present

## 2018-12-09 DIAGNOSIS — F0390 Unspecified dementia without behavioral disturbance: Secondary | ICD-10-CM | POA: Diagnosis not present

## 2018-12-09 DIAGNOSIS — Z9181 History of falling: Secondary | ICD-10-CM | POA: Diagnosis not present

## 2018-12-09 DIAGNOSIS — I10 Essential (primary) hypertension: Secondary | ICD-10-CM | POA: Diagnosis not present

## 2018-12-09 DIAGNOSIS — G9340 Encephalopathy, unspecified: Secondary | ICD-10-CM | POA: Diagnosis not present

## 2018-12-09 DIAGNOSIS — M6281 Muscle weakness (generalized): Secondary | ICD-10-CM | POA: Diagnosis not present

## 2018-12-09 DIAGNOSIS — I4891 Unspecified atrial fibrillation: Secondary | ICD-10-CM | POA: Diagnosis not present

## 2018-12-10 DIAGNOSIS — G9340 Encephalopathy, unspecified: Secondary | ICD-10-CM | POA: Diagnosis not present

## 2018-12-10 DIAGNOSIS — M6281 Muscle weakness (generalized): Secondary | ICD-10-CM | POA: Diagnosis not present

## 2018-12-10 DIAGNOSIS — Z9181 History of falling: Secondary | ICD-10-CM | POA: Diagnosis not present

## 2018-12-10 DIAGNOSIS — I4891 Unspecified atrial fibrillation: Secondary | ICD-10-CM | POA: Diagnosis not present

## 2018-12-10 DIAGNOSIS — F329 Major depressive disorder, single episode, unspecified: Secondary | ICD-10-CM | POA: Diagnosis not present

## 2018-12-10 DIAGNOSIS — E785 Hyperlipidemia, unspecified: Secondary | ICD-10-CM | POA: Diagnosis not present

## 2018-12-10 DIAGNOSIS — R2681 Unsteadiness on feet: Secondary | ICD-10-CM | POA: Diagnosis not present

## 2018-12-10 DIAGNOSIS — F0390 Unspecified dementia without behavioral disturbance: Secondary | ICD-10-CM | POA: Diagnosis not present

## 2018-12-10 DIAGNOSIS — I10 Essential (primary) hypertension: Secondary | ICD-10-CM | POA: Diagnosis not present

## 2018-12-10 DIAGNOSIS — G629 Polyneuropathy, unspecified: Secondary | ICD-10-CM | POA: Diagnosis not present

## 2018-12-24 DIAGNOSIS — B029 Zoster without complications: Secondary | ICD-10-CM | POA: Diagnosis not present

## 2019-01-10 DIAGNOSIS — I10 Essential (primary) hypertension: Secondary | ICD-10-CM | POA: Diagnosis not present

## 2019-01-10 DIAGNOSIS — W19XXXA Unspecified fall, initial encounter: Secondary | ICD-10-CM | POA: Diagnosis not present

## 2019-01-10 DIAGNOSIS — R109 Unspecified abdominal pain: Secondary | ICD-10-CM | POA: Diagnosis not present

## 2019-01-10 DIAGNOSIS — E860 Dehydration: Secondary | ICD-10-CM | POA: Diagnosis not present

## 2019-01-10 DIAGNOSIS — S0181XA Laceration without foreign body of other part of head, initial encounter: Secondary | ICD-10-CM | POA: Diagnosis not present

## 2019-01-10 DIAGNOSIS — R402142 Coma scale, eyes open, spontaneous, at arrival to emergency department: Secondary | ICD-10-CM | POA: Diagnosis not present

## 2019-01-10 DIAGNOSIS — S0990XA Unspecified injury of head, initial encounter: Secondary | ICD-10-CM | POA: Diagnosis not present

## 2019-01-10 DIAGNOSIS — F0390 Unspecified dementia without behavioral disturbance: Secondary | ICD-10-CM | POA: Diagnosis not present

## 2019-01-10 DIAGNOSIS — Z87891 Personal history of nicotine dependence: Secondary | ICD-10-CM | POA: Diagnosis not present

## 2019-01-10 DIAGNOSIS — R402362 Coma scale, best motor response, obeys commands, at arrival to emergency department: Secondary | ICD-10-CM | POA: Diagnosis not present

## 2019-01-10 DIAGNOSIS — E8889 Other specified metabolic disorders: Secondary | ICD-10-CM | POA: Diagnosis not present

## 2019-01-10 DIAGNOSIS — R402242 Coma scale, best verbal response, confused conversation, at arrival to emergency department: Secondary | ICD-10-CM | POA: Diagnosis not present

## 2019-01-10 DIAGNOSIS — Y9389 Activity, other specified: Secondary | ICD-10-CM | POA: Diagnosis not present

## 2019-01-10 DIAGNOSIS — Y9289 Other specified places as the place of occurrence of the external cause: Secondary | ICD-10-CM | POA: Diagnosis not present

## 2019-01-10 DIAGNOSIS — S0101XA Laceration without foreign body of scalp, initial encounter: Secondary | ICD-10-CM | POA: Diagnosis not present

## 2019-01-13 DIAGNOSIS — I4891 Unspecified atrial fibrillation: Secondary | ICD-10-CM | POA: Diagnosis not present

## 2019-01-13 DIAGNOSIS — W19XXXD Unspecified fall, subsequent encounter: Secondary | ICD-10-CM | POA: Diagnosis not present

## 2019-01-13 DIAGNOSIS — Z515 Encounter for palliative care: Secondary | ICD-10-CM | POA: Diagnosis not present

## 2019-01-13 DIAGNOSIS — F0390 Unspecified dementia without behavioral disturbance: Secondary | ICD-10-CM | POA: Diagnosis not present

## 2019-01-13 DIAGNOSIS — I10 Essential (primary) hypertension: Secondary | ICD-10-CM | POA: Diagnosis not present

## 2019-01-13 DIAGNOSIS — F0281 Dementia in other diseases classified elsewhere with behavioral disturbance: Secondary | ICD-10-CM | POA: Diagnosis not present

## 2019-01-13 DIAGNOSIS — G311 Senile degeneration of brain, not elsewhere classified: Secondary | ICD-10-CM | POA: Diagnosis not present

## 2019-01-13 DIAGNOSIS — R6889 Other general symptoms and signs: Secondary | ICD-10-CM | POA: Diagnosis not present

## 2019-01-14 DIAGNOSIS — G311 Senile degeneration of brain, not elsewhere classified: Secondary | ICD-10-CM | POA: Diagnosis not present

## 2019-01-14 DIAGNOSIS — F0281 Dementia in other diseases classified elsewhere with behavioral disturbance: Secondary | ICD-10-CM | POA: Diagnosis not present

## 2019-01-14 DIAGNOSIS — I10 Essential (primary) hypertension: Secondary | ICD-10-CM | POA: Diagnosis not present

## 2019-01-14 DIAGNOSIS — W19XXXD Unspecified fall, subsequent encounter: Secondary | ICD-10-CM | POA: Diagnosis not present

## 2019-01-14 DIAGNOSIS — Z515 Encounter for palliative care: Secondary | ICD-10-CM | POA: Diagnosis not present

## 2019-01-14 DIAGNOSIS — I4891 Unspecified atrial fibrillation: Secondary | ICD-10-CM | POA: Diagnosis not present

## 2019-01-17 DIAGNOSIS — F0281 Dementia in other diseases classified elsewhere with behavioral disturbance: Secondary | ICD-10-CM | POA: Diagnosis not present

## 2019-01-17 DIAGNOSIS — G311 Senile degeneration of brain, not elsewhere classified: Secondary | ICD-10-CM | POA: Diagnosis not present

## 2019-01-17 DIAGNOSIS — W19XXXD Unspecified fall, subsequent encounter: Secondary | ICD-10-CM | POA: Diagnosis not present

## 2019-01-17 DIAGNOSIS — I4891 Unspecified atrial fibrillation: Secondary | ICD-10-CM | POA: Diagnosis not present

## 2019-01-17 DIAGNOSIS — Z515 Encounter for palliative care: Secondary | ICD-10-CM | POA: Diagnosis not present

## 2019-01-17 DIAGNOSIS — I10 Essential (primary) hypertension: Secondary | ICD-10-CM | POA: Diagnosis not present

## 2019-01-18 DIAGNOSIS — W19XXXD Unspecified fall, subsequent encounter: Secondary | ICD-10-CM | POA: Diagnosis not present

## 2019-01-18 DIAGNOSIS — Z515 Encounter for palliative care: Secondary | ICD-10-CM | POA: Diagnosis not present

## 2019-01-18 DIAGNOSIS — F0281 Dementia in other diseases classified elsewhere with behavioral disturbance: Secondary | ICD-10-CM | POA: Diagnosis not present

## 2019-01-18 DIAGNOSIS — I10 Essential (primary) hypertension: Secondary | ICD-10-CM | POA: Diagnosis not present

## 2019-01-18 DIAGNOSIS — G311 Senile degeneration of brain, not elsewhere classified: Secondary | ICD-10-CM | POA: Diagnosis not present

## 2019-01-18 DIAGNOSIS — I4891 Unspecified atrial fibrillation: Secondary | ICD-10-CM | POA: Diagnosis not present

## 2019-01-19 DIAGNOSIS — W19XXXD Unspecified fall, subsequent encounter: Secondary | ICD-10-CM | POA: Diagnosis not present

## 2019-01-19 DIAGNOSIS — I4891 Unspecified atrial fibrillation: Secondary | ICD-10-CM | POA: Diagnosis not present

## 2019-01-19 DIAGNOSIS — I10 Essential (primary) hypertension: Secondary | ICD-10-CM | POA: Diagnosis not present

## 2019-01-19 DIAGNOSIS — F0281 Dementia in other diseases classified elsewhere with behavioral disturbance: Secondary | ICD-10-CM | POA: Diagnosis not present

## 2019-01-19 DIAGNOSIS — G311 Senile degeneration of brain, not elsewhere classified: Secondary | ICD-10-CM | POA: Diagnosis not present

## 2019-01-19 DIAGNOSIS — Z515 Encounter for palliative care: Secondary | ICD-10-CM | POA: Diagnosis not present

## 2019-01-20 DIAGNOSIS — G311 Senile degeneration of brain, not elsewhere classified: Secondary | ICD-10-CM | POA: Diagnosis not present

## 2019-01-20 DIAGNOSIS — Z515 Encounter for palliative care: Secondary | ICD-10-CM | POA: Diagnosis not present

## 2019-01-20 DIAGNOSIS — W19XXXD Unspecified fall, subsequent encounter: Secondary | ICD-10-CM | POA: Diagnosis not present

## 2019-01-20 DIAGNOSIS — I10 Essential (primary) hypertension: Secondary | ICD-10-CM | POA: Diagnosis not present

## 2019-01-20 DIAGNOSIS — I4891 Unspecified atrial fibrillation: Secondary | ICD-10-CM | POA: Diagnosis not present

## 2019-01-20 DIAGNOSIS — F0281 Dementia in other diseases classified elsewhere with behavioral disturbance: Secondary | ICD-10-CM | POA: Diagnosis not present

## 2019-01-21 DIAGNOSIS — W19XXXD Unspecified fall, subsequent encounter: Secondary | ICD-10-CM | POA: Diagnosis not present

## 2019-01-21 DIAGNOSIS — Z515 Encounter for palliative care: Secondary | ICD-10-CM | POA: Diagnosis not present

## 2019-01-21 DIAGNOSIS — I4891 Unspecified atrial fibrillation: Secondary | ICD-10-CM | POA: Diagnosis not present

## 2019-01-21 DIAGNOSIS — I10 Essential (primary) hypertension: Secondary | ICD-10-CM | POA: Diagnosis not present

## 2019-01-21 DIAGNOSIS — F0281 Dementia in other diseases classified elsewhere with behavioral disturbance: Secondary | ICD-10-CM | POA: Diagnosis not present

## 2019-01-21 DIAGNOSIS — G311 Senile degeneration of brain, not elsewhere classified: Secondary | ICD-10-CM | POA: Diagnosis not present

## 2019-01-24 DIAGNOSIS — I4891 Unspecified atrial fibrillation: Secondary | ICD-10-CM | POA: Diagnosis not present

## 2019-01-24 DIAGNOSIS — Z515 Encounter for palliative care: Secondary | ICD-10-CM | POA: Diagnosis not present

## 2019-01-24 DIAGNOSIS — I10 Essential (primary) hypertension: Secondary | ICD-10-CM | POA: Diagnosis not present

## 2019-01-24 DIAGNOSIS — F0281 Dementia in other diseases classified elsewhere with behavioral disturbance: Secondary | ICD-10-CM | POA: Diagnosis not present

## 2019-01-24 DIAGNOSIS — G311 Senile degeneration of brain, not elsewhere classified: Secondary | ICD-10-CM | POA: Diagnosis not present

## 2019-01-24 DIAGNOSIS — W19XXXD Unspecified fall, subsequent encounter: Secondary | ICD-10-CM | POA: Diagnosis not present

## 2019-01-25 DIAGNOSIS — G311 Senile degeneration of brain, not elsewhere classified: Secondary | ICD-10-CM | POA: Diagnosis not present

## 2019-01-25 DIAGNOSIS — I4891 Unspecified atrial fibrillation: Secondary | ICD-10-CM | POA: Diagnosis not present

## 2019-01-25 DIAGNOSIS — F0281 Dementia in other diseases classified elsewhere with behavioral disturbance: Secondary | ICD-10-CM | POA: Diagnosis not present

## 2019-01-25 DIAGNOSIS — I10 Essential (primary) hypertension: Secondary | ICD-10-CM | POA: Diagnosis not present

## 2019-01-25 DIAGNOSIS — Z515 Encounter for palliative care: Secondary | ICD-10-CM | POA: Diagnosis not present

## 2019-01-25 DIAGNOSIS — W19XXXD Unspecified fall, subsequent encounter: Secondary | ICD-10-CM | POA: Diagnosis not present

## 2019-01-27 DIAGNOSIS — Z515 Encounter for palliative care: Secondary | ICD-10-CM | POA: Diagnosis not present

## 2019-01-27 DIAGNOSIS — I10 Essential (primary) hypertension: Secondary | ICD-10-CM | POA: Diagnosis not present

## 2019-01-27 DIAGNOSIS — I4891 Unspecified atrial fibrillation: Secondary | ICD-10-CM | POA: Diagnosis not present

## 2019-01-27 DIAGNOSIS — F0281 Dementia in other diseases classified elsewhere with behavioral disturbance: Secondary | ICD-10-CM | POA: Diagnosis not present

## 2019-01-27 DIAGNOSIS — G311 Senile degeneration of brain, not elsewhere classified: Secondary | ICD-10-CM | POA: Diagnosis not present

## 2019-01-27 DIAGNOSIS — W19XXXD Unspecified fall, subsequent encounter: Secondary | ICD-10-CM | POA: Diagnosis not present

## 2019-01-28 DIAGNOSIS — F0281 Dementia in other diseases classified elsewhere with behavioral disturbance: Secondary | ICD-10-CM | POA: Diagnosis not present

## 2019-01-28 DIAGNOSIS — G311 Senile degeneration of brain, not elsewhere classified: Secondary | ICD-10-CM | POA: Diagnosis not present

## 2019-01-28 DIAGNOSIS — I4891 Unspecified atrial fibrillation: Secondary | ICD-10-CM | POA: Diagnosis not present

## 2019-01-28 DIAGNOSIS — W19XXXD Unspecified fall, subsequent encounter: Secondary | ICD-10-CM | POA: Diagnosis not present

## 2019-01-28 DIAGNOSIS — Z515 Encounter for palliative care: Secondary | ICD-10-CM | POA: Diagnosis not present

## 2019-01-28 DIAGNOSIS — I10 Essential (primary) hypertension: Secondary | ICD-10-CM | POA: Diagnosis not present

## 2019-01-29 DIAGNOSIS — I10 Essential (primary) hypertension: Secondary | ICD-10-CM | POA: Diagnosis not present

## 2019-01-29 DIAGNOSIS — W19XXXD Unspecified fall, subsequent encounter: Secondary | ICD-10-CM | POA: Diagnosis not present

## 2019-01-29 DIAGNOSIS — I4891 Unspecified atrial fibrillation: Secondary | ICD-10-CM | POA: Diagnosis not present

## 2019-01-29 DIAGNOSIS — G311 Senile degeneration of brain, not elsewhere classified: Secondary | ICD-10-CM | POA: Diagnosis not present

## 2019-01-29 DIAGNOSIS — Z515 Encounter for palliative care: Secondary | ICD-10-CM | POA: Diagnosis not present

## 2019-01-29 DIAGNOSIS — F0281 Dementia in other diseases classified elsewhere with behavioral disturbance: Secondary | ICD-10-CM | POA: Diagnosis not present

## 2019-01-31 ENCOUNTER — Other Ambulatory Visit: Payer: Self-pay

## 2019-01-31 DIAGNOSIS — I10 Essential (primary) hypertension: Secondary | ICD-10-CM | POA: Diagnosis not present

## 2019-01-31 DIAGNOSIS — W19XXXD Unspecified fall, subsequent encounter: Secondary | ICD-10-CM | POA: Diagnosis not present

## 2019-01-31 DIAGNOSIS — F0281 Dementia in other diseases classified elsewhere with behavioral disturbance: Secondary | ICD-10-CM | POA: Diagnosis not present

## 2019-01-31 DIAGNOSIS — G311 Senile degeneration of brain, not elsewhere classified: Secondary | ICD-10-CM | POA: Diagnosis not present

## 2019-01-31 DIAGNOSIS — Z515 Encounter for palliative care: Secondary | ICD-10-CM | POA: Diagnosis not present

## 2019-01-31 DIAGNOSIS — I4891 Unspecified atrial fibrillation: Secondary | ICD-10-CM | POA: Diagnosis not present

## 2019-02-01 DIAGNOSIS — F0281 Dementia in other diseases classified elsewhere with behavioral disturbance: Secondary | ICD-10-CM | POA: Diagnosis not present

## 2019-02-01 DIAGNOSIS — I10 Essential (primary) hypertension: Secondary | ICD-10-CM | POA: Diagnosis not present

## 2019-02-01 DIAGNOSIS — W19XXXD Unspecified fall, subsequent encounter: Secondary | ICD-10-CM | POA: Diagnosis not present

## 2019-02-01 DIAGNOSIS — G311 Senile degeneration of brain, not elsewhere classified: Secondary | ICD-10-CM | POA: Diagnosis not present

## 2019-02-01 DIAGNOSIS — I4891 Unspecified atrial fibrillation: Secondary | ICD-10-CM | POA: Diagnosis not present

## 2019-02-01 DIAGNOSIS — Z515 Encounter for palliative care: Secondary | ICD-10-CM | POA: Diagnosis not present

## 2019-02-06 ENCOUNTER — Encounter: Payer: Self-pay | Admitting: Family Medicine

## 2019-02-07 ENCOUNTER — Telehealth: Payer: Self-pay

## 2019-02-07 NOTE — Telephone Encounter (Signed)
Please start a condolence card for the family

## 2019-02-07 NOTE — Telephone Encounter (Signed)
Received Mychart message from Pt's daughter over weekend- Pt passed on 03/03/23.

## 2019-03-01 DEATH — deceased
